# Patient Record
Sex: Male | Born: 1970 | Race: White | Hispanic: No | Marital: Single | Smoking: Current every day smoker
Health system: Southern US, Community
[De-identification: ages and names within clinical notes are randomized; demographics above are authoritative.]

## PROBLEM LIST (undated history)

## (undated) DIAGNOSIS — F1910 Other psychoactive substance abuse, uncomplicated: Secondary | ICD-10-CM

## (undated) DIAGNOSIS — R0602 Shortness of breath: Secondary | ICD-10-CM

## (undated) DIAGNOSIS — I75029 Atheroembolism of unspecified lower extremity: Secondary | ICD-10-CM

## (undated) DIAGNOSIS — J449 Chronic obstructive pulmonary disease, unspecified: Secondary | ICD-10-CM

## (undated) DIAGNOSIS — D751 Secondary polycythemia: Secondary | ICD-10-CM

## (undated) DIAGNOSIS — F419 Anxiety disorder, unspecified: Secondary | ICD-10-CM

## (undated) HISTORY — PX: KNEE ARTHROSCOPY: SUR90

## (undated) HISTORY — PX: LEG SURGERY: SHX1003

## (undated) HISTORY — DX: Other psychoactive substance abuse, uncomplicated: F19.10

## (undated) HISTORY — DX: Secondary polycythemia: D75.1

## (undated) HISTORY — PX: SHOULDER ARTHROSCOPY: SHX128

---
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
## 1998-01-22 DIAGNOSIS — F1011 Alcohol abuse, in remission: Secondary | ICD-10-CM | POA: Insufficient documentation

## 2002-02-22 DIAGNOSIS — IMO0002 Reserved for concepts with insufficient information to code with codable children: Secondary | ICD-10-CM

## 2005-02-08 ENCOUNTER — Ambulatory Visit: Payer: Self-pay | Admitting: Family Medicine

## 2006-02-13 ENCOUNTER — Ambulatory Visit: Payer: Self-pay | Admitting: Family Medicine

## 2006-07-24 ENCOUNTER — Ambulatory Visit: Payer: Self-pay | Admitting: Family Medicine

## 2006-08-09 ENCOUNTER — Encounter: Payer: Self-pay | Admitting: Internal Medicine

## 2006-08-09 DIAGNOSIS — Z720 Tobacco use: Secondary | ICD-10-CM | POA: Insufficient documentation

## 2006-08-09 DIAGNOSIS — F17200 Nicotine dependence, unspecified, uncomplicated: Secondary | ICD-10-CM

## 2006-08-31 ENCOUNTER — Encounter (INDEPENDENT_AMBULATORY_CARE_PROVIDER_SITE_OTHER): Payer: Self-pay | Admitting: *Deleted

## 2008-03-09 ENCOUNTER — Ambulatory Visit: Payer: Self-pay | Admitting: Family Medicine

## 2008-03-24 ENCOUNTER — Ambulatory Visit: Payer: Self-pay | Admitting: Family Medicine

## 2008-03-24 DIAGNOSIS — J069 Acute upper respiratory infection, unspecified: Secondary | ICD-10-CM | POA: Insufficient documentation

## 2008-04-24 DIAGNOSIS — I75029 Atheroembolism of unspecified lower extremity: Secondary | ICD-10-CM

## 2008-04-24 HISTORY — DX: Atheroembolism of unspecified lower extremity: I75.029

## 2008-08-10 ENCOUNTER — Ambulatory Visit: Payer: Self-pay | Admitting: Family Medicine

## 2008-08-10 DIAGNOSIS — J301 Allergic rhinitis due to pollen: Secondary | ICD-10-CM

## 2008-11-19 ENCOUNTER — Encounter: Admission: RE | Admit: 2008-11-19 | Discharge: 2008-11-19 | Payer: Self-pay | Admitting: Internal Medicine

## 2009-08-09 ENCOUNTER — Encounter: Payer: Self-pay | Admitting: Family Medicine

## 2009-08-10 ENCOUNTER — Encounter: Payer: Self-pay | Admitting: Family Medicine

## 2009-08-11 ENCOUNTER — Encounter: Payer: Self-pay | Admitting: Family Medicine

## 2009-09-06 ENCOUNTER — Ambulatory Visit: Payer: Self-pay | Admitting: Family Medicine

## 2009-09-06 DIAGNOSIS — M79609 Pain in unspecified limb: Secondary | ICD-10-CM

## 2009-09-06 DIAGNOSIS — E782 Mixed hyperlipidemia: Secondary | ICD-10-CM

## 2009-09-08 ENCOUNTER — Encounter: Payer: Self-pay | Admitting: Family Medicine

## 2009-09-09 LAB — CONVERTED CEMR LAB
ALT: 24 units/L (ref 0–53)
Basophils Absolute: 0 10*3/uL (ref 0.0–0.1)
Basophils Relative: 0.4 % (ref 0.0–3.0)
Calcium: 9.7 mg/dL (ref 8.4–10.5)
Eosinophils Relative: 1.5 % (ref 0.0–5.0)
GFR calc non Af Amer: 126.43 mL/min (ref >60)
Hemoglobin: 17.6 g/dL — ABNORMAL HIGH (ref 13.0–17.0)
Lymphocytes Relative: 35.1 % (ref 12.0–46.0)
Monocytes Relative: 5.9 % (ref 3.0–12.0)
Neutro Abs: 5.4 10*3/uL (ref 1.4–7.7)
Potassium: 4.7 meq/L (ref 3.5–5.1)
RBC: 5.16 M/uL (ref 4.22–5.81)
Sodium: 137 meq/L (ref 135–145)
TSH: 0.8 microintl units/mL (ref 0.35–5.50)
Total Protein: 6.9 g/dL (ref 6.0–8.3)
WBC: 9.5 10*3/uL (ref 4.5–10.5)

## 2009-10-07 ENCOUNTER — Telehealth: Payer: Self-pay | Admitting: Family Medicine

## 2009-11-29 ENCOUNTER — Encounter (INDEPENDENT_AMBULATORY_CARE_PROVIDER_SITE_OTHER): Payer: Self-pay | Admitting: *Deleted

## 2010-05-02 ENCOUNTER — Ambulatory Visit
Admission: RE | Admit: 2010-05-02 | Discharge: 2010-05-02 | Payer: Self-pay | Source: Home / Self Care | Attending: Family Medicine | Admitting: Family Medicine

## 2010-05-02 DIAGNOSIS — R059 Cough, unspecified: Secondary | ICD-10-CM | POA: Insufficient documentation

## 2010-05-02 DIAGNOSIS — R05 Cough: Secondary | ICD-10-CM | POA: Insufficient documentation

## 2010-05-24 NOTE — Letter (Signed)
Summary: Generic Letter  [place] at [facility]  [address] [place], [place] [address]   Phone: [phone]  Fax: [phone]    09/08/2009  DONIVIN WIRT [address] [place], [place]  [address]  Dear Mr. Garrison,    We have been unable to contact you by telephone regarding your lab results.  Dr. Dayton Martes wants you to know that your liver, thyroid, and kidney function are all normal.  Platelets just a little low, but not low enough to worry about.  Please make sure you get records from Podiatry.          Sincerely,   Ruthe Mannan, MD

## 2010-05-24 NOTE — Progress Notes (Signed)
Summary: pt requests chantix  Phone Note Refill Request Message from:  Fax from Pharmacy  Refills Requested: Medication #1:  Chantix  continuing month pack [facility], pt is requesting continuing month pack.  [phone]  Initial call taken by: Lowella Petties CMA,  October 07, 2009 8:24 AM    New/Updated Medications: CHANTIX 1 MG TABS (VARENICLINE TARTRATE) 1 tab by mouth 2 times daily Prescriptions: CHANTIX 1 MG TABS (VARENICLINE TARTRATE) 1 tab by mouth 2 times daily  #60 x 3   Entered and Authorized by:   Ruthe Mannan MD   Signed by:   Ruthe Mannan MD on 10/07/2009   Method used:   Electronically to        [facility] (retail)       [address]       [place], [place]  [address]       Ph: [phone]       Fax: [phone]   RxID:   [id number]

## 2010-05-24 NOTE — Assessment & Plan Note (Signed)
Summary: BIG TOE NUMB/PAIN/WAS PURPLE/RBH   Vital Signs:  Patient profile:   40 year old male Height:      72 inches Weight:      116.25 pounds BMI:     15.82 Temp:     98.7 degrees F oral Pulse rate:   72 / minute Pulse rhythm:   regular BP sitting:   120 / 80  (right arm) Cuff size:   regular  Vitals Entered By: Linde Gillis CMA Duncan Dull) (Sep 06, 2009 3:13 PM) CC: big toe numb on right foot   History of Present Illness: 40 yo new to me here for Right toe numbness and discoloration x 1 month.  Was seeing a podiatrist, Dr. 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  Referred him to Dr. Allyson Sabal (cards) who performed ABIs which were normal but his right great toe had NO waveforms. Started on Plavix, which he continues to take.  Here today to find out what else can be done because it still hurts, tingles, is cold and blue. Denies any symptoms of claudication.  Smokes 2 ppd x 20 years.  Quit for a short time years ago cold [place].    Current Medications (verified): 1)  Trazodone Hcl 50 Mg Tabs (Trazodone Hcl) .... Take 1 Tablet By Mouth At Bedtime 2)  Alprazolam 1 Mg Tbdp (Alprazolam) .... Take 1 Tablet By Mouth Two Times A Day 3)  Plavix 75 Mg Tabs (Clopidogrel Bisulfate) .... Take One Tablet By Mouth Daily 4)  Chantix Starting Month Pak 0.5 Mg X 11 & 1 Mg X 42  Misc (Varenicline Tartrate) .... 0.5mg  By Mouth Once Daily For 3 Days, Then Twice Daily For 4 Days and Then 1mg  By Mouth 2 Times Daily  Allergies: 1)  ! * Nsaids  Review of Systems      See HPI CV:  Denies chest pain or discomfort, leg cramps with exertion, and swelling of feet.  Physical Exam  General:  Well-developed,well-nourished,in no acute distress; alert,appropriate and cooperative throughout examination, nonongested. Msk:  right great toe- purlpe, cold, tender to palp. Psych:  memory intact for recent and remote, normally interactive, and good eye contact.     Impression &  Recommendations:  Problem # 1:  TOE PAIN (ICD-729.5) Assessment New Awaiting records, but apparently no embolism. Continue Plavix.   Needs to stop smoking.  See below. Check FLP, hepatic panel, BMET, TSH and CBC.  Problem # 2:  NICOTINE ADDICTION (ICD-305.1) Assessment: Unchanged Time spent with patient 25 minutes, more than 50% of this time was spent counseling patient on purple toe syndrome and smoking cessation. Will start chantix.  Follow up in one month.  The following medications were removed from the medication list:    Chantix Starting Month Pak 0.5 Mg X 11 & 1 Mg X 42 Tabs (Varenicline tartrate) .Marland Kitchen... As dir    Chantix Continuing Month Pak 1 Mg Tabs (Varenicline tartrate) .Marland Kitchen... As dir His updated medication list for this problem includes:    Chantix Starting Month Pak 0.5 Mg X 11 & 1 Mg X 42 Misc (Varenicline tartrate) .Marland Kitchen... 0.5mg  by mouth once daily for 3 days, then twice daily for 4 days and then 1mg  by mouth 2 times daily  Complete Medication List: 1)  Trazodone Hcl 50 Mg Tabs (Trazodone hcl) .... Take 1 tablet by mouth at bedtime 2)  Alprazolam 1 Mg Tbdp (Alprazolam) .... Take 1 tablet by mouth two times a day 3)  Plavix 75 Mg Tabs (  Clopidogrel bisulfate) .... Take one tablet by mouth daily 4)  Chantix Starting Month Pak 0.5 Mg X 11 & 1 Mg X 42 Misc (Varenicline tartrate) .... 0.5mg  by mouth once daily for 3 days, then twice daily for 4 days and then 1mg  by mouth 2 times daily  Other Orders: Venipuncture ([address]) TLB-Hepatic/Liver Function Pnl (80076-HEPATIC) TLB-TSH (Thyroid Stimulating Hormone) (84443-TSH) TLB-BMP (Basic Metabolic Panel-BMET) (80048-METABOL) TLB-CBC Platelet - w/Differential (85025-CBCD) Prescriptions: CHANTIX STARTING MONTH PAK 0.5 MG X 11 & 1 MG X 42  MISC (VARENICLINE TARTRATE) 0.5mg  by mouth once daily for 3 days, then twice daily for 4 days and then 1mg  by mouth 2 times daily  #1 pack x 0   Entered and Authorized by:   Ruthe Mannan MD   Signed by:    Ruthe Mannan MD on 09/06/2009   Method used:   Electronically to        [facility]ail[facility]       [address]       [place], [place]  [address]       Ph: [phone]       Fax: [phone]   RxID:   [id number]   Current Allergies (reviewed today): ! * NSAIDS

## 2010-05-24 NOTE — Letter (Signed)
Summary: Tinnie Gens Petrinitz DPM  Tinnie Gens Petrinitz DPM   Imported By: Lanelle Bal 09/17/2009 12:02:30  _____________________________________________________________________  External Attachment:    Type:   Image     Comment:   External Document

## 2010-05-24 NOTE — Letter (Signed)
Summary: [facility]  [facility]   Imported By: Lanelle Bal 09/17/2009 12:03:34  _____________________________________________________________________  External Attachment:    Type:   Image     Comment:   External Document

## 2010-05-24 NOTE — Letter (Signed)
Summary: Nadara Eaton letter  [place] at [facility]  [address] [place], [place] [address]   Phone: [phone]  Fax: [phone]       11/29/2009 MRN: [id number]  [facility] [address] [place], [place]  [address]  Dear Mr. [facility] [place] and [place] announce the retirement of Arta Silence, M.D., from full-time practice at the [facility] office effective October 21, 2009 and his plans of returning part-time.  It is important to Dr. Hetty Ely and to our practice that you understand that [facility] has seven physicians in our office for your health care needs.  We will continue to offer the same exceptional care that you have today.    Dr. Hetty Ely has spoken to many of you about his plans for retirement and returning part-time in the fall.   We will continue to work with you through the transition to schedule appointments for you in the office and meet the high standards that [place] is committed to.   Again, it is with great pleasure that we share the news that Dr. Hetty Ely will return to [facility] in October of 2011 with a reduced schedule.    If you have any questions, or would like to request an appointment with one of our physicians, please call us at [phone] and press the option for Scheduling an appointment.  We take pleasure in providing you with excellent patient care and look forward to seeing you at your next office visit.  Our [facility] Physicians are:  Tillman Abide, M.D. Laurita Quint, M.D. Roxy Manns, M.D. Kerby Nora, M.D. Hannah Beat, M.D. Ruthe Mannan, M.D. We proudly welcomed Raechel Ache, M.D. and Eustaquio Boyden, M.D. to the practice in July/August 2011.  Sincerely,   [facility]

## 2010-05-24 NOTE — Letter (Signed)
Summary: [facility] / EVAL OF PURPLE R GREAT TOE /  [facility] / EVAL OF PURPLE R GREAT TOE / DR. Christiane Ha BERRY   Imported By: Carin Primrose 08/16/2009 14:50:40  _____________________________________________________________________  External Attachment:    Type:   Image     Comment:   External Document

## 2010-05-26 NOTE — Assessment & Plan Note (Signed)
Summary: COUGH   Vital Signs:  Patient profile:   40 year old male Height:      72 inches Weight:      118 pounds BMI:     16.06 Temp:     98.2 degrees F oral Pulse rate:   88 / minute Pulse rhythm:   regular BP sitting:   124 / 82  (left arm) Cuff size:   regular  Vitals Entered By: Delilah Shan CMA Sara Keys Dull) (May 02, 2010 11:04 AM) CC: Cough   History of Present Illness: Cough for last month and headcold.  Up at night coughing.  Not improving.  Used delsym and otc meds w/o much relief.  No nausea, no fevers.  Fatigued.  Clear sputum.  Occ rhinorrhea, but no nasal congestion. +wheeze.      Allergies: 1)  ! * Nsaids  Past History:  Past Medical History: tobacco abuse- didn't tolerate chantix insomnia H/o blue R 1st toe that, no waveform on prev exam by cards.  Prev was on plavix, off as of 2012.   Social History: Reviewed history and no changes required. 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  Otherwise negative.    Physical Exam  General:  thin, no apparent distress normocephalic atraumatic tm wnl nasal exam w/o erthema op with cobblestoning but no exudates. neck supple regular rate and rhythm lungs with diffuse bilateral exp wheezes but no decrease in bs locally ext w/o edema   Impression & Recommendations:  Problem # 1:  COUGH (ICD-786.2) d/w patient about stopping smoking, esp trying the gum.  He needs to quit.  Start prednisone and SABA.  w/o fever, shortness of breath, focal dec in bs, or change in sputum, I would not start antibiotics.  follow up as needed.  Nontoxic.  He agrees with plan .  Complete Medication List: 1)  Trazodone Hcl 50 Mg Tabs (Trazodone hcl) .... Take 1 tablet by mouth at bedtime 2)  Alprazolam 1 Mg Tbdp (Alprazolam) .... Take 1 tablet by mouth two times a day 3)  Ventolin Hfa 108 (90 Base) Mcg/act Aers (Albuterol sulfate) .... 2 puffs every  4 hours as needed for cough 4)  Prednisone 20 Mg Tabs (Prednisone) .Marland Kitchen.. 1 by mouth once daily for 5 days and then 1/2 by mouth once daily for 6 days. take with food.  Patient Instructions: 1)  Start the prednisone today.  Take as directed with food.  Use the inhaler as needed, every 4 hours.  If you aren't improving, let us know.  Get the nicotine gum (flavored) and try that to quit smoking.   Prescriptions: PREDNISONE 20 MG TABS (PREDNISONE) 1 by mouth once daily for 5 days and then 1/2 by mouth once daily for 6 days. take with food.  #8 x 0   Entered and Authorized by:   Crawford Givens MD   Signed by:   Crawford Givens MD on 05/02/2010   Method used:   [facility]  [place] (retail)       [address]       [place], [place]  [address]       Ph: [phone] or [phone]       Fax: [phone]   RxID:   [id number] VENTOLIN HFA 108 (90 BASE) MCG/ACT AERS (ALBUTEROL SULFATE) 2 puffs every 4 hours as needed  for cough  #1 x 1   Entered and Authorized by:   Crawford Givens MD   Signed by:   Crawford Givens MD on 05/02/2010   Method used:   [facility]        [facility]  [place] [facility]retail[facility]       [address]       [place], [place]  [address]       Ph: [phone] or [phone]       Fax: [phone]   RxID:   [id number]    Orders Added: 1)  Est. Patient Level III [[address]]    Current Allergies (reviewed today): ! * NSAIDS

## 2013-01-14 ENCOUNTER — Emergency Department: Payer: Self-pay | Admitting: Emergency Medicine

## 2013-01-14 LAB — URINALYSIS, COMPLETE
Glucose,UR: NEGATIVE mg/dL (ref 0–75)
Hyaline Cast: 2
Ketone: NEGATIVE
Specific Gravity: 1.012 (ref 1.003–1.030)
Squamous Epithelial: NONE SEEN
WBC UR: 2 /HPF (ref 0–5)

## 2013-01-14 LAB — COMPREHENSIVE METABOLIC PANEL
Albumin: 4 g/dL (ref 3.4–5.0)
Alkaline Phosphatase: 91 U/L (ref 50–136)
Anion Gap: 8 (ref 7–16)
BUN: 9 mg/dL (ref 7–18)
Bilirubin,Total: 0.5 mg/dL (ref 0.2–1.0)
Chloride: 99 mmol/L (ref 98–107)
Co2: 27 mmol/L (ref 21–32)
Creatinine: 0.96 mg/dL (ref 0.60–1.30)
EGFR (African American): >60
Glucose: 107 mg/dL — ABNORMAL HIGH (ref 65–99)
Potassium: 3.3 mmol/L — ABNORMAL LOW (ref 3.5–5.1)
SGPT (ALT): 26 U/L (ref 12–78)
Sodium: 134 mmol/L — ABNORMAL LOW (ref 136–145)

## 2013-01-14 LAB — CBC
HCT: 58.3 % — ABNORMAL HIGH (ref 40.0–52.0)
HGB: 19.9 g/dL — ABNORMAL HIGH (ref 13.0–18.0)
MCH: 34.3 pg — ABNORMAL HIGH (ref 26.0–34.0)
Platelet: 136 10*3/uL — ABNORMAL LOW (ref 150–440)
RBC: 5.81 10*6/uL (ref 4.40–5.90)
WBC: 12.3 10*3/uL — ABNORMAL HIGH (ref 3.8–10.6)

## 2013-01-20 ENCOUNTER — Encounter (INDEPENDENT_AMBULATORY_CARE_PROVIDER_SITE_OTHER): Payer: Self-pay | Admitting: Surgery

## 2013-01-20 ENCOUNTER — Ambulatory Visit (INDEPENDENT_AMBULATORY_CARE_PROVIDER_SITE_OTHER): Payer: BC Managed Care – PPO | Admitting: Surgery

## 2013-01-20 VITALS — BP 116/68 | HR 100 | Temp 99.1°F | Resp 14 | Ht 69.0 in | Wt 119.8 lb

## 2013-01-20 DIAGNOSIS — K8020 Calculus of gallbladder without cholecystitis without obstruction: Secondary | ICD-10-CM

## 2013-01-20 NOTE — Progress Notes (Signed)
Patient ID: Walter Alvarado, male   DOB: 1971-02-14, 42 y.o.   MRN: [id number]  Chief Complaint  Patient presents with  . New Evaluation    eval GB    HPI Walter Alvarado is a 42 y.o. male.  Patient sent at request of Dr. Janalyn Harder for epigastric abdominal pain. He was seen at the [facility] emergency department on 01/14/13 for epigastric abdominal pain. This had lasted for hours. It was severe. Associated nausea and vomiting. Ultrasound showed gallstones no gallbladder wall thickening. Symptoms made worse with eating. No radiation. Pain is sharp in nature. This subsided he was sent home. HPI  Past Medical History  Diagnosis Date  . Substance abuse     Past Surgical History  Procedure Laterality Date  . Leg surgery      broke leg    History reviewed. No pertinent family history.  Social History History  Substance Use Topics  . Smoking status: Current Every Day Smoker -- 2.00 packs/day  . Smokeless tobacco: Never Used  . Alcohol Use: Yes    Allergies  Allergen Reactions  . Nsaids     REACTION: INTOLERANT    Current Outpatient Prescriptions  Medication Sig Dispense Refill  . ALPRAZolam (XANAX) 1 MG tablet       . HYDROcodone-acetaminophen (NORCO/VICODIN) 5-325 MG per tablet       . ondansetron (ZOFRAN) 4 MG tablet       . traZODone (DESYREL) 50 MG tablet        No current facility-administered medications for this visit.    Review of Systems Review of Systems  Constitutional: Negative for fever, chills and unexpected weight change.  HENT: Negative for hearing loss, congestion, sore throat, trouble swallowing and voice change.   Eyes: Negative for visual disturbance.  Respiratory: Positive for cough. Negative for wheezing.   Cardiovascular: Negative for chest pain, palpitations and leg swelling.  Gastrointestinal: Positive for abdominal pain. Negative for nausea, vomiting, diarrhea, constipation, blood in stool, abdominal distention, anal  bleeding and rectal pain.  Genitourinary: Negative for hematuria and difficulty urinating.  Musculoskeletal: Negative for arthralgias.  Skin: Negative for rash and wound.  Neurological: Negative for seizures, syncope, weakness and headaches.  Hematological: Negative for adenopathy. Does not bruise/bleed easily.  Psychiatric/Behavioral: Negative for confusion.    Blood pressure 116/68, pulse 100, temperature 99.1 F (37.3 C), temperature source Temporal, resp. rate 14, height 5\' 9"  (1.753 m), weight 119 lb 12.8 oz (54.341 kg).  Physical Exam Physical Exam  Constitutional: He is oriented to person, place, and time. He appears well-developed and well-nourished.  HENT:  Head: Normocephalic and atraumatic.  Eyes: EOM are normal. Pupils are equal, round, and reactive to light. No scleral icterus.  Neck: Normal range of motion.  Cardiovascular: Normal rate and regular rhythm.   Pulmonary/Chest: Effort normal and breath sounds normal.  Abdominal: Soft. Bowel sounds are normal. He exhibits no mass. There is no tenderness. There is no rebound and no guarding.  Musculoskeletal: Normal range of motion.  Neurological: He is alert and oriented to person, place, and time.  Skin: Skin is warm and dry.  Psychiatric: He has a normal mood and affect. His behavior is normal. Judgment and thought content normal.    Data Reviewed U/s and labs from [facility]  Gallstones and normal  LFT.  CBD 5 mm   Assessment    Symptomatic cholelithiasis    Plan    Recommend laparoscopic cholecystectomy cholangiogram.The procedure has been discussed with the  patient. Operative and non operative treatments have been discussed. Risks of surgery include bleeding, infection,  Common bile duct injury,  Injury to the stomach,liver, colon,small intestine, abdominal wall,  Diaphragm,  Major blood vessels,  And the need for an open procedure.  Other risks include worsening of medical problems, death,  DVT and pulmonary embolism,  and cardiovascular events.   Medical options have also been discussed. The patient has been informed of long term expectations of surgery and non surgical options,  The patient agrees to proceed.         Taneal Sonntag A. 01/20/2013, 11:55 AM

## 2013-01-20 NOTE — Patient Instructions (Signed)

## 2013-01-29 ENCOUNTER — Encounter (INDEPENDENT_AMBULATORY_CARE_PROVIDER_SITE_OTHER): Payer: Self-pay

## 2013-01-31 ENCOUNTER — Encounter (HOSPITAL_COMMUNITY): Payer: Self-pay | Admitting: Pharmacy Technician

## 2013-02-03 ENCOUNTER — Encounter (HOSPITAL_COMMUNITY): Payer: Self-pay | Admitting: Vascular Surgery

## 2013-02-03 ENCOUNTER — Ambulatory Visit (HOSPITAL_COMMUNITY)
Admission: RE | Admit: 2013-02-03 | Discharge: 2013-02-03 | Disposition: A | Discharge status: Home / Self Care | Payer: BC Managed Care – PPO | Source: Ambulatory Visit | Attending: Anesthesiology | Admitting: Anesthesiology

## 2013-02-03 ENCOUNTER — Other Ambulatory Visit (INDEPENDENT_AMBULATORY_CARE_PROVIDER_SITE_OTHER): Payer: Self-pay | Admitting: Surgery

## 2013-02-03 ENCOUNTER — Encounter (HOSPITAL_COMMUNITY)
Admission: RE | Admit: 2013-02-03 | Discharge: 2013-02-03 | Disposition: A | Discharge status: Home / Self Care | Payer: BC Managed Care – PPO | Source: Ambulatory Visit | Attending: Surgery | Admitting: Surgery

## 2013-02-03 ENCOUNTER — Encounter (HOSPITAL_COMMUNITY): Payer: Self-pay

## 2013-02-03 DIAGNOSIS — Z01818 Encounter for other preprocedural examination: Secondary | ICD-10-CM | POA: Insufficient documentation

## 2013-02-03 DIAGNOSIS — Z01810 Encounter for preprocedural cardiovascular examination: Secondary | ICD-10-CM | POA: Insufficient documentation

## 2013-02-03 DIAGNOSIS — Z01812 Encounter for preprocedural laboratory examination: Secondary | ICD-10-CM | POA: Insufficient documentation

## 2013-02-03 DIAGNOSIS — K8020 Calculus of gallbladder without cholecystitis without obstruction: Secondary | ICD-10-CM | POA: Insufficient documentation

## 2013-02-03 HISTORY — DX: Atheroembolism of unspecified lower extremity: I75.029

## 2013-02-03 HISTORY — DX: Anxiety disorder, unspecified: F41.9

## 2013-02-03 HISTORY — DX: Shortness of breath: R06.02

## 2013-02-03 LAB — CBC WITH DIFFERENTIAL/PLATELET
Basophils Relative: 0 % (ref 0–1)
HCT: 57.8 % — ABNORMAL HIGH (ref 39.0–52.0)
Hemoglobin: 20.5 g/dL — ABNORMAL HIGH (ref 13.0–17.0)
MCH: 35.3 pg — ABNORMAL HIGH (ref 26.0–34.0)
MCHC: 35.5 g/dL (ref 30.0–36.0)
MCV: 99.7 fL (ref 78.0–100.0)
Monocytes Absolute: 0.7 10*3/uL (ref 0.1–1.0)
Monocytes Relative: 8 % (ref 3–12)
Neutro Abs: 5.3 10*3/uL (ref 1.7–7.7)

## 2013-02-03 LAB — COMPREHENSIVE METABOLIC PANEL
Albumin: 4 g/dL (ref 3.5–5.2)
BUN: 8 mg/dL (ref 6–23)
CO2: 27 mEq/L (ref 19–32)
Chloride: 95 mEq/L — ABNORMAL LOW (ref 96–112)
Creatinine, Ser: 0.69 mg/dL (ref 0.50–1.35)
GFR calc non Af Amer: >90 mL/min (ref >90)
Total Bilirubin: 0.5 mg/dL (ref 0.3–1.2)

## 2013-02-03 NOTE — Progress Notes (Signed)
Notified Charlie Pitter of pt. Substance abuse. Encourage to not to use marijuana several days prior to surgery.

## 2013-02-03 NOTE — Pre-Procedure Instructions (Signed)
JEREMAIH KLIMA  02/03/2013   Your procedure is scheduled on:  Thursday, October 23  Report to [facility] Main Entrance "A" at 10:00 AM.  Call this number if you have problems the morning of surgery: [phone]   Remember:   Do not eat food or drink liquids after midnight.   Take these medicines the morning of surgery with A SIP OF WATER: xanax   Do not wear jewelry, make-up or nail polish.  Do not wear lotions, powders, or perfumes. You may wear deodorant.  Do not shave 48 hours prior to surgery. Men may shave face and neck.  Do not bring valuables to the hospital.  [facility] is not responsible                  for any belongings or valuables.               Contacts, dentures or bridgework may not be worn into surgery.  Leave suitcase in the car. After surgery it may be brought to your room.  For patients admitted to the hospital, discharge time is determined by your                treatment team.               Patients discharged the day of surgery will not be allowed to drive  home.  Name and phone number of your driver: Rachael Rocca-mother  Special Instructions: Shower using CHG 2 nights before surgery and the night before surgery.  If you shower the day of surgery use CHG.  Use special wash - you have one bottle of CHG for all showers.  You should use approximately 1/3 of the bottle for each shower.   Please read over the following fact sheets that you were given: Pain Booklet, Coughing and Deep Breathing and Surgical Site Infection Prevention

## 2013-02-04 ENCOUNTER — Telehealth (INDEPENDENT_AMBULATORY_CARE_PROVIDER_SITE_OTHER): Payer: Self-pay

## 2013-02-04 ENCOUNTER — Encounter (HOSPITAL_COMMUNITY): Payer: Self-pay

## 2013-02-04 DIAGNOSIS — D45 Polycythemia vera: Secondary | ICD-10-CM

## 2013-02-04 NOTE — Telephone Encounter (Signed)
Spoke to pt mother. Advised we will need referral to hematology for clearance. 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 Will try to get appt this week so his surgery does not get moved back. We will call with appt.

## 2013-02-04 NOTE — Progress Notes (Signed)
Anesthesia Chart Review:  Patient is a 42 year old male scheduled for laparoscopic cholecystectomy on 02/13/13 by Dr. Luisa Hart.  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  He was previously followed at [facility], but has not been seen there since 2012.  Meds listed: Xanax PRN, trazadone Q HS.  EKG on 02/03/13 showed NSR, LAD. Currently, there are no comparison EKGs available.  CXR on 02/03/13 showed no active cardiopulmonary disease.  Preoperative labs noted. H/H 20.5/57.8. (Previously 17.6/51.8.)  PLT count 142K.  LFTs WNL. He did not report a history of known polycythemia. I reviewed with anesthesiologist Dr. Noreene Larsson who agrees with preoperative hematology evaluation.  He will notify Dr. Luisa Hart.  Velna Ochs [facility] Short Stay Center/Anesthesiology Phone [phone] 02/04/2013 5:06 PM

## 2013-02-05 ENCOUNTER — Telehealth: Payer: Self-pay | Admitting: Oncology

## 2013-02-05 ENCOUNTER — Telehealth (INDEPENDENT_AMBULATORY_CARE_PROVIDER_SITE_OTHER): Payer: Self-pay | Admitting: General Surgery

## 2013-02-05 NOTE — Telephone Encounter (Signed)
C/D 1015/14 for appt. 02/17/13

## 2013-02-05 NOTE — Telephone Encounter (Signed)
S/W REFERRING OFFICE AND SCHEDULE NP APPT 10/27 @ 10:30 W/DR. LIVESAY REFERRING DR. Luisa Hart  DX-POLYCTHEMIA WELCOME PACKET MAILED.

## 2013-02-05 NOTE — Telephone Encounter (Signed)
Spoke with pt and his mother an explained that I was able to get him scheduled with Dr. Darrold Span on 02/17/13 at 10:30.  Address was given.  I also explained that because we were unable to get the appointment before his scheduled surgery date, we would have to postpone the surgery until we received clearance from Dr. Darrold Span.  The patient verbalized that he understood.

## 2013-02-14 ENCOUNTER — Encounter (INDEPENDENT_AMBULATORY_CARE_PROVIDER_SITE_OTHER): Payer: BC Managed Care – PPO | Admitting: Surgery

## 2013-02-15 ENCOUNTER — Other Ambulatory Visit: Payer: Self-pay | Admitting: Oncology

## 2013-02-15 DIAGNOSIS — D751 Secondary polycythemia: Secondary | ICD-10-CM

## 2013-02-17 ENCOUNTER — Ambulatory Visit (HOSPITAL_BASED_OUTPATIENT_CLINIC_OR_DEPARTMENT_OTHER): Payer: BC Managed Care – PPO | Admitting: Oncology

## 2013-02-17 ENCOUNTER — Ambulatory Visit (HOSPITAL_BASED_OUTPATIENT_CLINIC_OR_DEPARTMENT_OTHER): Payer: BC Managed Care – PPO

## 2013-02-17 ENCOUNTER — Other Ambulatory Visit (HOSPITAL_BASED_OUTPATIENT_CLINIC_OR_DEPARTMENT_OTHER): Payer: BC Managed Care – PPO | Admitting: Lab

## 2013-02-17 ENCOUNTER — Encounter: Payer: Self-pay | Admitting: Oncology

## 2013-02-17 VITALS — BP 127/86 | HR 86 | Temp 98.5°F | Resp 18 | Ht 69.5 in | Wt 120.7 lb

## 2013-02-17 DIAGNOSIS — F1010 Alcohol abuse, uncomplicated: Secondary | ICD-10-CM

## 2013-02-17 DIAGNOSIS — R718 Other abnormality of red blood cells: Secondary | ICD-10-CM

## 2013-02-17 DIAGNOSIS — D696 Thrombocytopenia, unspecified: Secondary | ICD-10-CM

## 2013-02-17 DIAGNOSIS — D46C Myelodysplastic syndrome with isolated del(5q) chromosomal abnormality: Secondary | ICD-10-CM

## 2013-02-17 DIAGNOSIS — D45 Polycythemia vera: Secondary | ICD-10-CM

## 2013-02-17 DIAGNOSIS — E860 Dehydration: Secondary | ICD-10-CM

## 2013-02-17 DIAGNOSIS — F17200 Nicotine dependence, unspecified, uncomplicated: Secondary | ICD-10-CM

## 2013-02-17 DIAGNOSIS — D751 Secondary polycythemia: Secondary | ICD-10-CM

## 2013-02-17 DIAGNOSIS — K8020 Calculus of gallbladder without cholecystitis without obstruction: Secondary | ICD-10-CM

## 2013-02-17 LAB — MORPHOLOGY

## 2013-02-17 LAB — CBC WITH DIFFERENTIAL/PLATELET
BASO%: 0.6 % (ref 0.0–2.0)
LYMPH%: 24.1 % (ref 14.0–49.0)
MCH: 34.3 pg — ABNORMAL HIGH (ref 27.2–33.4)
MCHC: 33.7 g/dL (ref 32.0–36.0)
MCV: 101.9 fL — ABNORMAL HIGH (ref 79.3–98.0)
MONO%: 9.1 % (ref 0.0–14.0)
Platelets: 111 10*3/uL — ABNORMAL LOW (ref 140–400)
RBC: 5.9 10*6/uL — ABNORMAL HIGH (ref 4.20–5.82)

## 2013-02-17 LAB — URINALYSIS, MICROSCOPIC - CHCC
Glucose: 500 mg/dL
Nitrite: NEGATIVE
Urobilinogen, UR: 0.2 mg/dL (ref 0.2–1)

## 2013-02-17 NOTE — Patient Instructions (Signed)
Drink 8-16 ounces of plain fluid (no caffeine, no alcohol) for every Mtn Dew and every beer that you drink, which should be more plain fluid than the caffeine and alcohol.  Drink caffeine free Mtn Dew as possible  For next 24 hours, keep written log of every cigarette you smoke, what time/ where/ what you are doing. Usually 1/2 - 2/3 are habit cigarettes, which you can stop as soon as you know which are the habit ones. This will decrease nicotine in your system and make it easier to quit.   Willette Pa will call you today about smoking cessation assistance from [facility] -- classes, pointers, coupons etc.  We will ask lung doctors to check your lung function also.   Call if questions later this week   [phone]

## 2013-02-17 NOTE — Progress Notes (Signed)
Checked in new patient with no finanacial issues. Gave appt card.

## 2013-02-18 ENCOUNTER — Telehealth: Payer: Self-pay | Admitting: *Deleted

## 2013-02-18 ENCOUNTER — Encounter: Payer: Self-pay | Admitting: Oncology

## 2013-02-18 DIAGNOSIS — F1010 Alcohol abuse, uncomplicated: Secondary | ICD-10-CM | POA: Insufficient documentation

## 2013-02-18 DIAGNOSIS — D751 Secondary polycythemia: Secondary | ICD-10-CM | POA: Insufficient documentation

## 2013-02-18 DIAGNOSIS — K8020 Calculus of gallbladder without cholecystitis without obstruction: Secondary | ICD-10-CM | POA: Insufficient documentation
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
## 2013-02-18 NOTE — Telephone Encounter (Signed)
Talked and spoke with pt regarding smoking cessation.  He stated he was going to stop after his last pack.  We discussed side effects of nicotine withdrawal.  We discussed chewing on straws.  He stated he also like sucking on lifesavers.  He seems motivated to quit.  I will send information on smoking cessation class.

## 2013-02-24 ENCOUNTER — Encounter (INDEPENDENT_AMBULATORY_CARE_PROVIDER_SITE_OTHER): Payer: BC Managed Care – PPO | Admitting: Surgery

## 2013-03-03 ENCOUNTER — Encounter (INDEPENDENT_AMBULATORY_CARE_PROVIDER_SITE_OTHER): Payer: Self-pay

## 2013-03-03 ENCOUNTER — Ambulatory Visit (HOSPITAL_BASED_OUTPATIENT_CLINIC_OR_DEPARTMENT_OTHER): Payer: BC Managed Care – PPO | Admitting: Oncology

## 2013-03-03 ENCOUNTER — Encounter: Payer: Self-pay | Admitting: Oncology

## 2013-03-03 ENCOUNTER — Telehealth: Payer: Self-pay | Admitting: *Deleted

## 2013-03-03 ENCOUNTER — Other Ambulatory Visit (HOSPITAL_BASED_OUTPATIENT_CLINIC_OR_DEPARTMENT_OTHER): Payer: BC Managed Care – PPO | Admitting: Lab

## 2013-03-03 VITALS — BP 116/80 | HR 109 | Temp 98.3°F | Resp 18 | Ht 69.0 in | Wt 121.5 lb

## 2013-03-03 DIAGNOSIS — F17200 Nicotine dependence, unspecified, uncomplicated: Secondary | ICD-10-CM

## 2013-03-03 DIAGNOSIS — D696 Thrombocytopenia, unspecified: Secondary | ICD-10-CM

## 2013-03-03 DIAGNOSIS — F1010 Alcohol abuse, uncomplicated: Secondary | ICD-10-CM

## 2013-03-03 DIAGNOSIS — D751 Secondary polycythemia: Secondary | ICD-10-CM

## 2013-03-03 LAB — CBC & DIFF AND RETIC
Basophils Absolute: 0 10*3/uL (ref 0.0–0.1)
Eosinophils Absolute: 0.1 10*3/uL (ref 0.0–0.5)
HCT: 59.5 % — ABNORMAL HIGH (ref 38.4–49.9)
HGB: 21 g/dL — ABNORMAL HIGH (ref 13.0–17.1)
Immature Retic Fract: 1.6 % — ABNORMAL LOW (ref 3.00–10.60)
LYMPH%: 30.8 % (ref 14.0–49.0)
MCH: 35.2 pg — ABNORMAL HIGH (ref 27.2–33.4)
MCV: 99.7 fL — ABNORMAL HIGH (ref 79.3–98.0)
MONO#: 0.7 10*3/uL (ref 0.1–0.9)
MONO%: 7.6 % (ref 0.0–14.0)
NEUT#: 5.8 10*3/uL (ref 1.5–6.5)
NEUT%: 60.7 % (ref 39.0–75.0)
Platelets: 120 10*3/uL — ABNORMAL LOW (ref 140–400)
RDW: 13.5 % (ref 11.0–14.6)
WBC: 9.6 10*3/uL (ref 4.0–10.3)
nRBC: 0 % (ref 0–0)

## 2013-03-03 NOTE — Progress Notes (Signed)
OFFICE PROGRESS NOTE   03/03/2013   Physicians:   Warren Lacy, Crawford Givens (PCP [facility]). New patient referral M.Ramaswamy 03-10-13   INTERVAL HISTORY:  Patient is seen, alone for visit, in follow up of elevated hemoglobin/ hematocrit which were found during preop evaluation for cholecystectomy, which has been put on hold until hematologic condition improved. It seems likely that the polycythemia is related to lifestyle factors (heavy tobacco, caffeine & ETOH exclusively as fluids), tho this still may be a primary condition.  Since he was here for new patient consultation 2 weeks ago, Mr Knoll has decreased smoking from 1.5 - 2 ppd down to 5 cigarettes daily for the past week. He was previously drinking at least 6 cans of [facility] and 6 beers daily; and now is drinking 2 cans of [facility], 2 beers and 2.5 large gatorades daily He is having some HA which seem to be caffeine withdrawal, no other neurologic symptoms. He had one episode of RUQ pain on 02-21-13 after eating hot dog with chili, not as intense as presentation with cholelithiasis and resolved with 2 vicodin. He feels more energetic since he has resumed eating his regular diet. He is irritable with decrease in smoking but very determined to continue this progress. He does not complain of puritis or increased SOB.  He hopes all procedures can be completed by end of year as he has met deductible for insurance.    HISTORY Patient has had no regular medical care in years, tho has been seen possibly for bronchitis by [place] [facility], and only prior CBC available is from 08-2009. He has long, heavy, ongoing tobacco and alcohol use. He was in usual health until acute, severe RUQ pain on 01-14-13, which persisted for several hours such that he presented to [facility] ED. [place] there reportedly showed gallstones, with liver and pancreas normal and no mention of spleen. Per fiance, he was given dilaudid in ED, with  decrease in O2 sat to 83 after that medication. He was referred to Dr Luisa Hart and scheduled for laparoscopic cholecystectomy on 02-13-13, however preop labs 02-03-2013 had hemoglobin 20.5 with HCT 57.8, also WBC 8.3, platelets 142k, normal automated differential and MCV 99.7. The previous CBC 09-06-2009 had WBC 9.5, Hgb 17.6, HCT 51.8, plt 142k and MCV 100.4. CMET was normal other than Na 134 and Cl 95. At consultation visit at [facility] on 02-17-13, CBC had WBC 7.9, Hgb 20.3, HCT 60.1 and plt 111k; urinalysis was negative for blood; erythropoietin level was 4.7.   Review of systems as above, also: No fever. No nausea or vomiting. No cough. No other neurologic symptoms.  Remainder of 10 point Review of Systems negative.  Objective:  Vital signs in last 24 hours:  BP 116/80  Pulse 109  Temp(Src) 98.3 F (36.8 C) (Oral)  Resp 18  Ht 5\' 9"  (1.753 m)  Wt 121 lb 8 oz (55.112 kg)  BMI 17.93 kg/m2  Weight is up 1 lb. Hyperemic/ flushed as previously Alert, oriented and appropriate. Easily mobile.    HEENT:PERRL, sclerae not icteric. Oral mucosa moist without lesions, posterior pharynx clear.  Neck supple. No JVD.  Lymphatics:no cervical,suraclavicular, axillary adenopathy Resp: diminished breath sounds thruout otherwise clear to auscultation bilaterally and percussion bilaterally Cardio: regular rate and rhythm, clear heart sounds GI: soft, nontender, not distended, no mass or organomegaly. Normally active bowel sounds Musculoskeletal/ Extremities: without pitting edema, cords, tenderness Neuro: CN, motor, sensory, cerebellar grossly nonfocal. Psych as above Skin without rash,  ecchymosis, petechiae. Diffusely flushed  .   Lab Results:  Results for orders placed in visit on 03/03/13  CBC & DIFF AND RETIC      Result Value Range   WBC 9.6  4.0 - 10.3 10e3/uL   NEUT# 5.8  1.5 - 6.5 10e3/uL   HGB 21.0 (*) 13.0 - 17.1 g/dL   HCT 09.8 (*) 11.9 - 14.7 %   Platelets 120 (*) 140 - 400 10e3/uL    MCV 99.7 (*) 79.3 - 98.0 fL   MCH 35.2 (*) 27.2 - 33.4 pg   MCHC 35.3  32.0 - 36.0 g/dL   RBC 8.29 (*) 5.62 - 1.30 10e6/uL   RDW 13.5  11.0 - 14.6 %   lymph# 3.0  0.9 - 3.3 10e3/uL   MONO# 0.7  0.1 - 0.9 10e3/uL   Eosinophils Absolute 0.1  0.0 - 0.5 10e3/uL   Basophils Absolute 0.0  0.0 - 0.1 10e3/uL   NEUT% 60.7  39.0 - 75.0 %   LYMPH% 30.8  14.0 - 49.0 %   MONO% 7.6  0.0 - 14.0 %   EOS% 0.7  0.0 - 7.0 %   BASO% 0.2  0.0 - 2.0 %   nRBC 0  0 - 0 %   Retic % 0.92  0.80 - 1.80 %   Retic Ct Abs 54.92  34.80 - 93.90 10e3/uL   Immature Retic Fract 1.60 (*) 3.00 - 10.60 %     Studies/Results:  No results found.  Medications: I have reviewed the patient's current medications.  DISCUSSION: we have discussed smoking cessation, decrease in caffeine and ETOH, increase in free water intake, with considerable progress on his part in a very short time. It may take longer than time thus far to see any improvement in counts if these factors are responsible for secondary polycythemia. He understands that the problem could still be primary, but situation seems stable to follow closely. Phlebotomy has not been done as it seems likely he is still dehydrated to some extent.  He is aware of pulmonary consultation, ? If enough lung disease to contribute to polycythemia  Assessment/Plan: 1.Polycythemia: At this point seems most likely secondary, with history suggesting long, heavy tobacco abuse and chronic dehydration as causative. Tho other counts do not suggest P.vera, note low epo, so present plan is to follow closely as he improves the other factors.  2.thrombocytopenia: slightly better today, no bleeding. Likely from ETOH suppression of bone marrow or possibly splenomegaly that I cannot appreciate on exam now.  3.elevated MCV likely with ETOH  4.cholelithiasis: symptomatic late Sept, laparoscopic cholecystectomy on hold with polycythemia. Patient understands to contact surgeon or go to ED if  recurrent significant pain  5.ETOH abuse ongoing, tho he has cut back in past 2 weeks 6.tobacco abuse: actively trying to stop smoking.   7.strong family history of heart disease, including MI in 81 yo sister. No clear concerns from history above, but obviously high risk with tobacco and family history  8.no other recent medical care   I will see him again with counts in ~ 2 weeks. All questions answered and he can call if needed prior to next appointment.    Matvey Llanas P, MD   03/03/2013, 11:30 PM

## 2013-03-03 NOTE — Patient Instructions (Signed)
Continue lots of nonalcoholic fluids with no caffeine   Keep working to stop smoking entirely.   Call if needed before next scheduled appointment   [phone]

## 2013-03-03 NOTE — Telephone Encounter (Signed)
appts made and printed...td 

## 2013-03-03 NOTE — Telephone Encounter (Signed)
Spoke with pt today regarding smoking cessation.  Pt stated he had cut back from 2 packs a day to 5-6 cigaretts a day.  I asked if he would be willing to go to class or me to mail resource information, he is not interested at this time.  I gave him my phone number to call if I could help with any further efforts to quit smoking.  He was thankful for the phone call.

## 2013-03-10 ENCOUNTER — Ambulatory Visit (INDEPENDENT_AMBULATORY_CARE_PROVIDER_SITE_OTHER): Payer: BC Managed Care – PPO | Admitting: Internal Medicine

## 2013-03-10 ENCOUNTER — Encounter: Payer: Self-pay | Admitting: Internal Medicine

## 2013-03-10 VITALS — BP 118/80 | HR 88 | Ht 69.5 in | Wt 122.2 lb

## 2013-03-10 DIAGNOSIS — R0602 Shortness of breath: Secondary | ICD-10-CM

## 2013-03-10 DIAGNOSIS — F17200 Nicotine dependence, unspecified, uncomplicated: Secondary | ICD-10-CM

## 2013-03-10 DIAGNOSIS — J449 Chronic obstructive pulmonary disease, unspecified: Secondary | ICD-10-CM

## 2013-03-10 MED ORDER — TIOTROPIUM BROMIDE MONOHYDRATE 18 MCG IN CAPS: 18.0000 ug | ORAL_CAPSULE | Freq: Every day | RESPIRATORY_TRACT | Status: DC

## 2013-03-10 NOTE — Patient Instructions (Signed)
#  COPD - you have severe copd  - taking daily inhalers will  Help with stress relief on your lungs and reduce your hemoglobin - Please start spiriva 1 puff daily - take sample, script and show technique - glad you had flu shot  - advised pneumonia vaccine but you refused - follow with primary care doctor due to $ issues  - he can add other inhalers if needed  - would recommend him to check alpha 1 genetic test for copd when you can afford  #SMoking tobacco and marijuan  - you need to quit, take help from primary care doctor  #Followup  - as needed in pulmonary offfice  - primary care to manage you due to $ issues

## 2013-03-10 NOTE — Progress Notes (Signed)
Subjective:    Patient ID: Walter Alvarado, male    DOB: August 15, 1970, 42 y.o.   MRN: [id number] PCP Laurita Quint, MD Hematologist - Dr Jama Flavors HPI  42 year old male referred to pulmonary by hematologist for potential pulmonary issues. He presented for cholecystectomy but was found to have a hemoglobin of 20 g percent and subsequently ended up in the hematologist office. Workup such as second polycythemia due to heavy caffeine, alcohol, smoking tobacco and smoking marijuana use with associated dehydration. Cholecystectomy is been deferred to hemoglobin has improved. As part of this he had a chest x-ray 02/03/2013 which is normal. He does not have any active significant pulmonary complaints and therefore he is puzzled as to why he is in the pulmonary office. He says he works part-time and is a single parent with limited resources and is ready complaining of high health care costs. He does not want further followup here and prefers primary care management.  On further detailed questioning he does admit to mild to moderate chronic dyspnea and cough that is unchanged present for several years of insidious onset. Is not associated with any chest pain or edema.   CAT COPD Symptom & Quality of Life Score (GSK trademark) 0 is no burden. 5 is highest burden 03/10/2013   Never Cough -> Cough all the time 3  No phlegm in chest -> Chest is full of phlegm 2  No chest tightness -> Chest feels very tight 0  No dyspnea for 1 flight stairs/hill -> Very dyspneic for 1 flight of stairs 2  No limitations for ADL at home -> Very limited with ADL at home 0  Confident leaving home -> Not at all confident leaving home 0  Sleep soundly -> Do not sleep soundly because of lung condition 2  Lots of Energy -> No energy at all 2  TOTAL Score (max 40)  11      Other releavant hx  =- smoking tobacco and J - heavy since age 56. Does not want med help. Says will cut on own    Investigations  CXR 02/03/13 -  no active cardopulm disease  Spirometry today shows severe obstructive lung disease with FEV1 of 1.9 L/48% and a ratio 64   Walk test 02/17/13 in Heme office - no desatuirations  Past Medical History  Diagnosis Date  . Substance abuse   . Anxiety   . Shortness of breath     2-3 wks ago started sob at bedtime  . Blue toe syndrome 2010    right big toe     Family History  Problem Relation Age of Onset  . Cancer Maternal Grandmother   . Aneurysm Mother   . Heart attack Sister 36  . Cirrhosis Father      History   Social History  . Marital Status: Divorced    Spouse Name: N/A    Number of Children: N/A  . Years of Education: N/A   Occupational History  . Not on file.   Social History Main Topics  . Smoking status: Current Every Day Smoker -- 2.00 packs/day for 31 years    Types: Cigarettes  . Smokeless tobacco: Never Used     Comment: 2 cigs a day  . Alcohol Use: 1.8 oz/week    3 Cans of beer per week     Comment: 3-4 beers per night  . Drug Use: 2.00 per week    Special: Marijuana  . Sexual Activity: Not on file   Other  Topics Concern  . Not on file   Social History Narrative  . No narrative on file     Allergies  Allergen Reactions  . Nsaids     REACTION: INTOLERANT     Outpatient Prescriptions Prior to Visit  Medication Sig Dispense Refill  . ALPRAZolam (XANAX) 1 MG tablet Take 1 mg by mouth 4 (four) times daily as needed for anxiety.       . traZODone (DESYREL) 50 MG tablet Take 100 mg by mouth at bedtime.        No facility-administered medications prior to visit.      Review of Systems  Constitutional: Negative for fever and unexpected weight change.  HENT: Negative for congestion, dental problem, ear pain, nosebleeds, postnasal drip, rhinorrhea, sinus pressure, sneezing, sore throat and trouble swallowing.   Eyes: Negative for redness and itching.  Respiratory: Positive for cough and shortness of breath. Negative for chest tightness and  wheezing.   Cardiovascular: Negative for palpitations and leg swelling.  Gastrointestinal: Negative for nausea and vomiting.  Genitourinary: Negative for dysuria.  Musculoskeletal: Negative for joint swelling.  Skin: Negative for rash.  Neurological: Negative for headaches.  Hematological: Does not bruise/bleed easily.  Psychiatric/Behavioral: Negative for dysphoric mood. The patient is not nervous/anxious.        Objective:   Physical Exam  Nursing note and vitals reviewed. Constitutional: He is oriented to person, place, and time. He appears well-developed and well-nourished. No distress.  Unkempt male  HENT:  Head: Normocephalic and atraumatic.  Right Ear: External ear normal.  Left Ear: External ear normal.  Mouth/Throat: Oropharynx is clear and moist. No oropharyngeal exudate.  Eyes: Conjunctivae and EOM are normal. Pupils are equal, round, and reactive to light. Right eye exhibits no discharge. Left eye exhibits no discharge. No scleral icterus.  Neck: Normal range of motion. Neck supple. No JVD present. No tracheal deviation present. No thyromegaly present.  Cardiovascular: Normal rate, regular rhythm and intact distal pulses.  Exam reveals no gallop and no friction rub.   No murmur heard. Pulmonary/Chest: Effort normal and breath sounds normal. No respiratory distress. He has no wheezes. He has no rales. He exhibits no tenderness.  Abdominal: Soft. Bowel sounds are normal. He exhibits no distension and no mass. There is no tenderness. There is no rebound and no guarding.  Musculoskeletal: Normal range of motion. He exhibits no edema and no tenderness.  Lymphadenopathy:    He has no cervical adenopathy.  Neurological: He is alert and oriented to person, place, and time. He has normal reflexes. No cranial nerve deficit. Coordination normal.  Skin: Skin is warm and dry. No rash noted. He is not diaphoretic. No erythema. No pallor.  Psychiatric: He has a normal mood and affect.  His behavior is normal. Judgment and thought content normal.          Assessment & Plan:

## 2013-03-11 DIAGNOSIS — J449 Chronic obstructive pulmonary disease, unspecified: Secondary | ICD-10-CM | POA: Insufficient documentation

## 2013-03-11 NOTE — Assessment & Plan Note (Signed)
#  COPD - you have severe copd  - taking daily inhalers will  Help with stress relief on your lungs and reduce your hemoglobin - Please start spiriva 1 puff daily - take sample, script and show technique - glad you had flu shot  - advised pneumonia vaccine but you refused - follow with primary care doctor due to $ issues  - he can add other inhalers if needed  - would recommend him to check alpha 1 genetic test for copd when you can afford  #Followup  - as needed in pulmonary offfice  - primary care to manage you due to $ issues

## 2013-03-11 NOTE — Assessment & Plan Note (Signed)
#  SMoking tobacco and marijuan  - you need to quit, take help from primary care doctor

## 2013-03-12 ENCOUNTER — Ambulatory Visit: Admit: 2013-03-12 | Payer: Self-pay | Admitting: Surgery

## 2013-03-12 SURGERY — LAPAROSCOPIC CHOLECYSTECTOMY WITH INTRAOPERATIVE CHOLANGIOGRAM
Anesthesia: General

## 2013-03-13 ENCOUNTER — Other Ambulatory Visit: Payer: Self-pay | Admitting: Oncology

## 2013-03-13 DIAGNOSIS — D751 Secondary polycythemia: Secondary | ICD-10-CM

## 2013-03-17 ENCOUNTER — Telehealth: Payer: Self-pay | Admitting: *Deleted

## 2013-03-17 ENCOUNTER — Ambulatory Visit (HOSPITAL_BASED_OUTPATIENT_CLINIC_OR_DEPARTMENT_OTHER): Payer: BC Managed Care – PPO | Admitting: Oncology

## 2013-03-17 ENCOUNTER — Other Ambulatory Visit (HOSPITAL_BASED_OUTPATIENT_CLINIC_OR_DEPARTMENT_OTHER): Payer: BC Managed Care – PPO | Admitting: Lab

## 2013-03-17 ENCOUNTER — Encounter: Payer: Self-pay | Admitting: Oncology

## 2013-03-17 VITALS — BP 121/78 | HR 84 | Temp 98.7°F | Resp 18 | Ht 69.0 in | Wt 123.9 lb

## 2013-03-17 DIAGNOSIS — F17200 Nicotine dependence, unspecified, uncomplicated: Secondary | ICD-10-CM

## 2013-03-17 DIAGNOSIS — J449 Chronic obstructive pulmonary disease, unspecified: Secondary | ICD-10-CM

## 2013-03-17 DIAGNOSIS — D751 Secondary polycythemia: Secondary | ICD-10-CM

## 2013-03-17 DIAGNOSIS — D45 Polycythemia vera: Secondary | ICD-10-CM

## 2013-03-17 DIAGNOSIS — D696 Thrombocytopenia, unspecified: Secondary | ICD-10-CM

## 2013-03-17 DIAGNOSIS — E860 Dehydration: Secondary | ICD-10-CM

## 2013-03-17 DIAGNOSIS — F1010 Alcohol abuse, uncomplicated: Secondary | ICD-10-CM

## 2013-03-17 LAB — CBC WITH DIFFERENTIAL/PLATELET
BASO%: 0.4 % (ref 0.0–2.0)
Basophils Absolute: 0 10*3/uL (ref 0.0–0.1)
Eosinophils Absolute: 0.1 10*3/uL (ref 0.0–0.5)
HCT: 62.5 % — ABNORMAL HIGH (ref 38.4–49.9)
HGB: 20.5 g/dL — ABNORMAL HIGH (ref 13.0–17.1)
MCH: 33.8 pg — ABNORMAL HIGH (ref 27.2–33.4)
MCHC: 32.9 g/dL (ref 32.0–36.0)
MONO#: 0.6 10*3/uL (ref 0.1–0.9)
MONO%: 8.2 % (ref 0.0–14.0)
NEUT#: 5.1 10*3/uL (ref 1.5–6.5)
NEUT%: 67.5 % (ref 39.0–75.0)
Platelets: 132 10*3/uL — ABNORMAL LOW (ref 140–400)
WBC: 7.6 10*3/uL (ref 4.0–10.3)
lymph#: 1.7 10*3/uL (ref 0.9–3.3)

## 2013-03-17 NOTE — Telephone Encounter (Signed)
appts made and printed. gv pt info for Dr. Crawford Givens...td

## 2013-03-17 NOTE — Progress Notes (Signed)
OFFICE PROGRESS NOTE   03/17/2013   Physicians:T. Cornett, Crawford Givens (PCP Ursina Mercer), M.Ramaswamy    INTERVAL HISTORY:  Patient is seen, alone for visit, in continuing attention to polycythemia which appears secondary to severe COPD and chronic dehydration related to alcohol and caffeine. The polycythemia was identified in preop lab work for symptomatic cholelithiasis, with planned cholecystectomy delayed for hematology evaluation. In addition to evaluation at this office, he was seen by Dr Marchelle Gearing on 03-10-13, with spirometry showing severe obstructive lung disease. Mr. Dhondt has made considerable progress decreasing his smoking from 2 ppd to 2-3 cigarettes/ 24 hours now, decreasing caffeinated soda from previous 6 cans daily, decreasing daily beer from 6 daily to present 3 daily, and now drinking ~ 32 oz other liquids daily (previously none). He has had no abdominal pain in past 3 weeks, despite no dietary restrictions for the cholelithiasis. Patient is concerned that he has already given payment to surgery office and needs the procedure before end of year.  We have discussed smoking cessation and I have encouraged him to add nicotine gum immediately after meals. He is now using Spiriva inhaler once daily per pulmonary. We have discussed the ongoing alcohol as diuretic; I have encouraged him to decrease this further and to add water in addition to present gatorade.    HISTORY Patient has had no regular medical care in years, tho has been seen possibly for bronchitis by [place] at [facility], and only prior CBC available is from 08-2009. He has long, heavy, ongoing tobacco and alcohol use. He was in usual health until acute, severe RUQ pain on 01-14-13, which persisted for several hours such that he presented to [facility] ED. [place] there reportedly showed gallstones, with liver and pancreas normal and no mention of spleen. Per fiance, he was given dilaudid in ED, with decrease  in O2 sat to 83 after that medication. He was referred to Dr Luisa Hart and scheduled for laparoscopic cholecystectomy on 02-13-13, however preop labs 02-03-2013 had hemoglobin 20.5 with HCT 57.8, also WBC 8.3, platelets 142k, normal automated differential and MCV 99.7. The previous CBC 09-06-2009 had WBC 9.5, Hgb 17.6, HCT 51.8, plt 142k and MCV 100.4. CMET was normal other than Na 134 and Cl 95. At consultation visit at [facility] on 02-17-13, CBC had WBC 7.9, Hgb 20.3, HCT 60.1 and plt 111k; urinalysis was negative for blood; erythropoietin level was 4.7.   Review of systems as above, also: Improved cough. Less chest tightness. Better appetite and he is pleased with some weight gain. No itching. Remainder of 10 point Review of Systems negative.  Objective:  Vital signs in last 24 hours: 121/78, wgt 123 lb 14 oz, HR 84 reg, resp 18 not labored RA, temp 98.7. Weight is up almost 2 more lbs. Alert, oriented and appropriate. Ambulatory without difficulty.  Less ruddy complexion than initially  HEENT:PERRL, sclerae not icteric. Oral mucosa moist without lesions, posterior pharynx clear.  Neck supple. No JVD.  Lymphatics:no cervical,suraclavicular, axillary or inguinal adenopathy Resp: diminished breath sounds thruout otherwise clear to auscultation bilaterally and hyperresonant to percussion bilaterally. No wheezes or rales, no use of accessory muscles Cardio: regular rate and rhythm. No gallop. GI: soft, nontender including RUQ, not distended, no mass or organomegaly. Normally active bowel sounds.  Musculoskeletal/ Extremities: without pitting edema, cords, tenderness Neuro:  nonfocal Psych as above Skin  Ruddy thruout without rash, ecchymosis, petechiae   Lab Results:  Results for orders placed in visit on 03/17/13  CBC WITH  DIFFERENTIAL      Result Value Range   WBC 7.6  4.0 - 10.3 10e3/uL   NEUT# 5.1  1.5 - 6.5 10e3/uL   HGB 20.5 (*) 13.0 - 17.1 g/dL   HCT 40.9 (*) 81.1 - 91.4 %    Platelets 132 (*) 140 - 400 10e3/uL   MCV 102.7 (*) 79.3 - 98.0 fL   MCH 33.8 (*) 27.2 - 33.4 pg   MCHC 32.9  32.0 - 36.0 g/dL   RBC 7.82 (*) 9.56 - 2.13 10e6/uL   RDW 14.1  11.0 - 14.6 %   lymph# 1.7  0.9 - 3.3 10e3/uL   MONO# 0.6  0.1 - 0.9 10e3/uL   Eosinophils Absolute 0.1  0.0 - 0.5 10e3/uL   Basophils Absolute 0.0  0.0 - 0.1 10e3/uL   NEUT% 67.5  39.0 - 75.0 %   LYMPH% 23.0  14.0 - 49.0 %   MONO% 8.2  0.0 - 14.0 %   EOS% 0.9  0.0 - 7.0 %   BASO% 0.4  0.0 - 2.0 %    LAP pending Studies/Results:  No results found.  Medications: I have reviewed the patient's current medications.  DISCUSSION: all lifestyle factors influencing polycythemia discussed again as above.  Note no mention of splenomegaly on US done at [facility] 12-2012. Erythropoietin level was in normal range (NOT low). He has not had bone marrow evaluation or JAK2 testing, but has clear reasons for secondary polycythemia.    Assessment/Plan: 1.Polycythemia:  seems most likely secondary from heavy tobacco abuse with severe obstructive COPD and chronic dehydration . Tho other counts do not suggest P.vera, note low epo, so present plan is to follow as he improves the other factors.  2.thrombocytopenia: slightly better today, no bleeding. Likely from ETOH suppression of bone marrow, improving with less ETOH 3.elevated MCV likely with ETOH  4.cholelithiasis: symptomatic late Sept. Refer back to Dr Luisa Hart. He has met deductible and prefers to have this done before end of year if possible 5.ETOH abuse ongoing, tho he has cut back  6.tobacco abuse: actively trying to stop smoking.  7.strong family history of heart disease, including MI in 30 yo sister.  8.no other recent medical care   He should see PCP in ~ 4 weeks and I will see him again in ~ 3 months or sooner if needed. He has prn follow up with Dr Diona Browner, MD   03/17/2013, 8:30 AM

## 2013-03-17 NOTE — Patient Instructions (Signed)
You can do the Spiriva inhaler once daily whatever time of day is best, at the same time each day  Add more plain water to your gatorade. The alcohol is really acting as a fluid pill (diuretic) for you and keeping you dehydrated enough that it is making the blood more concentrated. Try to stop the alcohol also  Try nicotine gun at the times of day that are worst for wanting those 2-3 cigarettes, before you even feel that you want the cigarette go ahead with the nicotine gum.

## 2013-03-19 LAB — LEUKOCYTE ALKALINE PHOSPHATASE: Leukocyte Alkaline Phos Stain: 66 (ref 22–124)

## 2013-03-26 ENCOUNTER — Telehealth (INDEPENDENT_AMBULATORY_CARE_PROVIDER_SITE_OTHER): Payer: Self-pay

## 2013-03-26 NOTE — Telephone Encounter (Signed)
Called pt. He is scheduled to come in Friday to reschedule surgery.

## 2013-03-26 NOTE — Telephone Encounter (Signed)
Message copied by Brennan Bailey on Wed Mar 26, 2013 12:10 PM ------      Message from: Delcie Roch      Created: Wed Mar 26, 2013 11:35 AM      Regarding: ??Reschedule surgery       In working patient credit accounts, I found that pt paid a deposit for surgery which was canceled due to other health issues.  Pt was referred to Dr. Darrold Span for further treatment.  His 11/24 note states to refer back to Dr. Luisa Hart because the deposit has been paid, and patient wants the surgery before the end of the year.  Just double checking to prevent any misunderstanding with the patient.  Also, if Dr. Luisa Hart does not intend to proceed due to health issues, I will needs to refund the money.             ------

## 2013-03-28 ENCOUNTER — Encounter (INDEPENDENT_AMBULATORY_CARE_PROVIDER_SITE_OTHER): Payer: Self-pay | Admitting: Surgery

## 2013-03-28 ENCOUNTER — Ambulatory Visit (INDEPENDENT_AMBULATORY_CARE_PROVIDER_SITE_OTHER): Payer: BC Managed Care – PPO | Admitting: Surgery

## 2013-03-28 VITALS — BP 122/80 | HR 72 | Temp 98.6°F | Resp 14 | Ht 69.5 in | Wt 123.8 lb

## 2013-03-28 DIAGNOSIS — D45 Polycythemia vera: Secondary | ICD-10-CM

## 2013-03-28 DIAGNOSIS — K8020 Calculus of gallbladder without cholecystitis without obstruction: Secondary | ICD-10-CM

## 2013-03-28 NOTE — Patient Instructions (Signed)

## 2013-03-28 NOTE — Progress Notes (Signed)
Patient ID: Walter Alvarado, male   DOB: 24-Jun-1970, 42 y.o.   MRN: [id number]  Chief Complaint  Patient presents with  . Routine Post Op    discuss gb sx    HPI Walter Alvarado is a 42 y.o. male.  Patient sent at request of Dr. Janalyn Harder for epigastric abdominal pain. He was seen at the [facility] emergency department on 01/14/13 for epigastric abdominal pain. This had lasted for hours. It was severe. Associated nausea and vomiting. Ultrasound showed gallstones no gallbladder wall thickening. Symptoms made worse with eating. No radiation. Pain is sharp in nature. This subsided he was sent home. Patient returns to reschedule surgery since he was diagnosed with elevated RBC secondary to COPD, chronic and alcohol abuse. He has been cleared by hematology to proceed with surgery. No new complaints otherwise. He has cut back his drinking and smoking he states. HPI  Past Medical History  Diagnosis Date  . Substance abuse   . Anxiety   . Shortness of breath     2-3 wks ago started sob at bedtime  . 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 Polycythemia     Past Surgical History  Procedure Laterality Date  . Leg surgery      broke leg  . Knee arthroscopy Left     took all the  cartlige out  . Knee arthroscopy Right   . Shoulder arthroscopy Right   . Leg surgery Left     placed metal rod (femur)after 6 months took it out  . External ear surgery Right 1989    Family History  Problem Relation Age of Onset  . Cancer Maternal Grandmother   . Aneurysm Mother   . Heart attack Sister 28  . Cirrhosis Father     Social History History  Substance Use Topics  . Smoking status: Current Every Day Smoker -- 2.00 packs/day for 31 years    Types: Cigarettes  . Smokeless tobacco: Never Used     Comment: 2 cigs a day  . Alcohol Use: 1.8 oz/week    3 Cans of beer per week     Comment: 3-4 beers per night    Allergies  Allergen Reactions  . Nsaids     REACTION:  INTOLERANT    Current Outpatient Prescriptions  Medication Sig Dispense Refill  . ALPRAZolam (XANAX) 1 MG tablet Take 1 mg by mouth 4 (four) times daily as needed for anxiety.       Marland Kitchen tiotropium (SPIRIVA) 18 MCG inhalation capsule Place 1 capsule (18 mcg total) into inhaler and inhale daily.  30 capsule  6  . traZODone (DESYREL) 50 MG tablet Take 100 mg by mouth at bedtime.        No current facility-administered medications for this visit.    Review of Systems Review of Systems  Constitutional: Negative for fever, chills and unexpected weight change.  HENT: Negative for hearing loss, congestion, sore throat, trouble swallowing and voice change.   Eyes: Negative for visual disturbance.  Respiratory: Positive for cough. Negative for wheezing.   Cardiovascular: Negative for chest pain, palpitations and leg swelling.  Gastrointestinal: Positive for abdominal pain. Negative for nausea, vomiting, diarrhea, constipation, blood in stool, abdominal distention, anal bleeding and rectal pain.  Genitourinary: Negative for hematuria and difficulty urinating.  Musculoskeletal: Negative for arthralgias.  Skin: Negative for rash and wound.  Neurological: Negative for seizures, syncope, weakness and headaches.  Hematological: Negative for adenopathy. Does  not bruise/bleed easily.  Psychiatric/Behavioral: Negative for confusion.    Blood pressure 122/80, pulse 72, temperature 98.6 F (37 C), temperature source Temporal, resp. rate 14, height 5' 9.5" (1.765 m), weight 123 lb 12.8 oz (56.155 kg).  Physical Exam Physical Exam  Constitutional: He is oriented to person, place, and time. He appears well-developed and well-nourished.  HENT:  Head: Normocephalic and atraumatic.  Eyes: EOM are normal. Pupils are equal, round, and reactive to light. No scleral icterus.  Neck: Normal range of motion.  Cardiovascular: Normal rate and regular rhythm.   Pulmonary/Chest: Effort normal and breath sounds  normal.  Abdominal: Soft. Bowel sounds are normal. He exhibits no mass. There is no tenderness. There is no rebound and no guarding.  Musculoskeletal: Normal range of motion.  Neurological: He is alert and oriented to person, place, and time.  Skin: Skin is warm and dry.  Psychiatric: He has a normal mood and affect. His behavior is normal. Judgment and thought content normal.    Data Reviewed U/s and labs from [facility]  Gallstones and normal  LFT.  CBD 5 mm   Assessment    Symptomatic cholelithiasis COPD  Alcohol abuse  Elevated RBC count from above evaluated by hematology and now clear for surgery and    Plan    Recommend laparoscopic cholecystectomy cholangiogram.The procedure has been discussed with the patient. Operative and non operative treatments have been discussed. Risks of surgery include bleeding, infection,  Common bile duct injury,  Injury to the stomach,liver, colon,small intestine, abdominal wall,  Diaphragm,  Major blood vessels,  And the need for an open procedure.  Other risks include worsening of medical problems, death,  DVT and pulmonary embolism, and cardiovascular events.   Medical options have also been discussed. The patient has been informed of long term expectations of surgery and non surgical options,  The patient agrees to proceed.  Patient understands increased cardiovascular risk given his medical problems and elevated RBC count. Increased risk of myocardial infarction, stroke, DVT and other issues. He still wishes to proceed at this point and has begun lifestyle modifications to improve his underlying condition.       Walter Ferdig A. 03/28/2013, 11:10 AM

## 2013-04-01 ENCOUNTER — Encounter (HOSPITAL_COMMUNITY): Payer: Self-pay | Admitting: Pharmacy Technician

## 2013-04-10 NOTE — Pre-Procedure Instructions (Signed)
Walter Alvarado  04/10/2013   Your procedure is scheduled on:  Wednesday, April 16, 2013 at 8:30 AM.   Report to [facility] Entrance "A" Admitting Office at 6:30 AM.   Call this number if you have problems the morning of surgery: [phone]   Remember:   Do not eat food or drink liquids after midnight Tuesday, 04/15/13.   Take these medicines the morning of surgery with A SIP OF WATER: Spiriva, Xanax - if needed (STOP ASPIRIN, COUMADIN, PLAVIX, EFFIENT, HERBAL MEDICINES)   Do not wear jewelry.  Do not wear lotions, powders, or cologne. You may wear deodorant.  Men may shave face and neck.  Do not bring valuables to the hospital.  [facility] is not responsible                  for any belongings or valuables.               Contacts, dentures or bridgework may not be worn into surgery.  Leave suitcase in the car. After surgery it may be brought to your room.  For patients admitted to the hospital, discharge time is determined by your                treatment team.               Patients discharged the day of surgery will not be allowed to drive  home.  Name and phone number of your driver: Friend/family   Special Instructions: Shower using CHG 2 nights before surgery and the night before surgery.  If you shower the day of surgery use CHG.  Use special wash - you have one bottle of CHG for all showers.  You should use approximately 1/3 of the bottle for each shower.   Please read over the following fact sheets that you were given: Pain Booklet, Coughing and Deep Breathing and Surgical Site Infection Prevention

## 2013-04-11 ENCOUNTER — Encounter (HOSPITAL_COMMUNITY)
Admission: RE | Admit: 2013-04-11 | Discharge: 2013-04-11 | Disposition: A | Discharge status: Home / Self Care | Payer: BC Managed Care – PPO | Source: Ambulatory Visit | Attending: Surgery | Admitting: Surgery

## 2013-04-11 ENCOUNTER — Encounter (HOSPITAL_COMMUNITY): Payer: Self-pay

## 2013-04-11 DIAGNOSIS — Z01812 Encounter for preprocedural laboratory examination: Secondary | ICD-10-CM | POA: Insufficient documentation

## 2013-04-11 HISTORY — DX: Chronic obstructive pulmonary disease, unspecified: J44.9

## 2013-04-11 LAB — COMPREHENSIVE METABOLIC PANEL
AST: 17 U/L (ref 0–37)
Albumin: 3.7 g/dL (ref 3.5–5.2)
Alkaline Phosphatase: 84 U/L (ref 39–117)
Chloride: 99 mEq/L (ref 96–112)
Creatinine, Ser: 0.73 mg/dL (ref 0.50–1.35)
Potassium: 4.4 mEq/L (ref 3.5–5.1)
Total Bilirubin: 0.5 mg/dL (ref 0.3–1.2)

## 2013-04-11 LAB — CBC WITH DIFFERENTIAL/PLATELET
Basophils Absolute: 0 10*3/uL (ref 0.0–0.1)
Basophils Relative: 1 % (ref 0–1)
Eosinophils Absolute: 0 10*3/uL (ref 0.0–0.7)
Hemoglobin: 21.7 g/dL (ref 13.0–17.0)
Lymphocytes Relative: 33 % (ref 12–46)
MCH: 35.6 pg — ABNORMAL HIGH (ref 26.0–34.0)
MCHC: 35.6 g/dL (ref 30.0–36.0)
Monocytes Absolute: 0.5 10*3/uL (ref 0.1–1.0)
Monocytes Relative: 10 % (ref 3–12)
Neutro Abs: 3.1 10*3/uL (ref 1.7–7.7)
Neutrophils Relative %: 57 % (ref 43–77)
RDW: 13.3 % (ref 11.5–15.5)
WBC: 5.5 10*3/uL (ref 4.0–10.5)

## 2013-04-11 NOTE — Progress Notes (Signed)
Anesthesia Chart Review: Patient is a 42 year old male scheduled for laparoscopic cholecystectomy on 04/16/13 by Dr. Luisa Hart.  Procedure was initially scheduled for 02/13/13, but was postponed due finding of polycythemia on labs.    Patient has since had a hematology evaluation by Dr. Jama Flavors who felt polycythemia was secondary to smoking and chronic dehydration from caffeine and ETOH.  Currently, lifestyle modification and continued monitoring have been recommended.  Dr. Darrold Span did not feel that other labs suggested polycythemia vera.  Dr. Darrold Span also referred patient to pulmonologist Dr. Kalman Shan and diagnosed with severe COPD with recommendations for Spiriva with PRN pulmonary follow-up but continued COPD management by his PCP.   History includes substance abuse including tobacco (smoking since age 73), ETOH and marijuana, polycythemia, blue toe syndrome involving his right great toe '10, COPD with orthopnea. He was previously followed at [facility], but has not been seen there since 2012.   Meds listed: Xanax PRN, trazadone Q HS, Spiriva.   EKG on 02/03/13 showed NSR, LAD. Currently, there are no comparison EKGs available.   CXR on 02/03/13 showed no active cardiopulmonary disease.   Spirometry on 02/28/13 showed severe obstructive lung disease with FEV1 of 1.9 L/48% and a ratio 64.  Walk test on 02/17/13 at [facility] showed no desaturations.   Preoperative labs noted. His H/H remain elevated but around his baseline since 01/2013.  PLT remains decreased, now at 110K (felt to be related to ETOH suppression of bone marrow or possibly splenomegaly--though not mentioned on 01/2013 abdominal ultrasound). AST/ALT WNL.  He has had hematology and pulmonology evaluations since 01/2013.  Thus far, Dr. Darrold Span has recommended monitoring and lifestyle changes for treatment of his polycythemia.   According to Dr.Cornett's notes, hematology has cleared patient for surgery  now.  Anticipate that he can proceed as planned  Velna Ochs [facility]/Anesthesiology Phone [phone] 04/11/2013 2:14 PM

## 2013-04-15 MED ORDER — CHLORHEXIDINE GLUCONATE 4 % EX LIQD: 1.0000 "application " | Freq: Once | CUTANEOUS | Status: DC

## 2013-04-15 MED ORDER — DEXTROSE 5 % IV SOLN
3.0000 g | INTRAVENOUS | Status: AC
  Administered 2013-04-16: 2 g via INTRAVENOUS
  Filled 2013-04-15: qty 3000

## 2013-04-16 ENCOUNTER — Ambulatory Visit (HOSPITAL_COMMUNITY): Payer: BC Managed Care – PPO | Admitting: Anesthesiology

## 2013-04-16 ENCOUNTER — Encounter (HOSPITAL_COMMUNITY): Payer: Self-pay | Admitting: *Deleted

## 2013-04-16 ENCOUNTER — Encounter (HOSPITAL_COMMUNITY): Payer: BC Managed Care – PPO | Admitting: Vascular Surgery

## 2013-04-16 ENCOUNTER — Ambulatory Visit (HOSPITAL_COMMUNITY)
Admission: RE | Admit: 2013-04-16 | Discharge: 2013-04-16 | Disposition: A | Discharge status: Home / Self Care | Payer: BC Managed Care – PPO | Source: Ambulatory Visit | Attending: Surgery | Admitting: Surgery

## 2013-04-16 ENCOUNTER — Encounter (HOSPITAL_COMMUNITY)
Admission: RE | Disposition: A | Discharge status: Home / Self Care | Payer: Self-pay | Source: Ambulatory Visit | Attending: Surgery

## 2013-04-16 ENCOUNTER — Ambulatory Visit (HOSPITAL_COMMUNITY): Payer: BC Managed Care – PPO

## 2013-04-16 DIAGNOSIS — K8010 Calculus of gallbladder with chronic cholecystitis without obstruction: Secondary | ICD-10-CM | POA: Insufficient documentation

## 2013-04-16 DIAGNOSIS — J4489 Other specified chronic obstructive pulmonary disease: Secondary | ICD-10-CM | POA: Insufficient documentation

## 2013-04-16 DIAGNOSIS — K8066 Calculus of gallbladder and bile duct with acute and chronic cholecystitis without obstruction: Secondary | ICD-10-CM

## 2013-04-16 DIAGNOSIS — K8000 Calculus of gallbladder with acute cholecystitis without obstruction: Secondary | ICD-10-CM | POA: Insufficient documentation

## 2013-04-16 DIAGNOSIS — K8020 Calculus of gallbladder without cholecystitis without obstruction: Secondary | ICD-10-CM

## 2013-04-16 DIAGNOSIS — J449 Chronic obstructive pulmonary disease, unspecified: Secondary | ICD-10-CM | POA: Insufficient documentation

## 2013-04-16 DIAGNOSIS — F17200 Nicotine dependence, unspecified, uncomplicated: Secondary | ICD-10-CM | POA: Insufficient documentation

## 2013-04-16 DIAGNOSIS — R718 Other abnormality of red blood cells: Secondary | ICD-10-CM | POA: Insufficient documentation

## 2013-04-16 DIAGNOSIS — F1010 Alcohol abuse, uncomplicated: Secondary | ICD-10-CM | POA: Insufficient documentation

## 2013-04-16 HISTORY — PX: CHOLECYSTECTOMY: SHX55

## 2013-04-16 SURGERY — LAPAROSCOPIC CHOLECYSTECTOMY WITH INTRAOPERATIVE CHOLANGIOGRAM
Anesthesia: General

## 2013-04-16 MED ORDER — MIDAZOLAM HCL 5 MG/5ML IJ SOLN
INTRAMUSCULAR | Status: DC | PRN
  Administered 2013-04-16: 2 mg via INTRAVENOUS

## 2013-04-16 MED ORDER — ONDANSETRON HCL 4 MG/2ML IJ SOLN
INTRAMUSCULAR | Status: DC | PRN
  Administered 2013-04-16: 4 mg via INTRAVENOUS

## 2013-04-16 MED ORDER — TRAMADOL HCL 50 MG PO TABS
ORAL_TABLET | ORAL | Status: AC
  Filled 2013-04-16: qty 1

## 2013-04-16 MED ORDER — BUPIVACAINE-EPINEPHRINE (PF) 0.25% -1:200000 IJ SOLN
INTRAMUSCULAR | Status: AC
  Filled 2013-04-16: qty 30

## 2013-04-16 MED ORDER — HYDROMORPHONE HCL PF 1 MG/ML IJ SOLN
INTRAMUSCULAR | Status: AC
  Filled 2013-04-16: qty 1

## 2013-04-16 MED ORDER — TRAMADOL HCL 50 MG PO TABS: 50.0000 mg | ORAL_TABLET | Freq: Four times a day (QID) | ORAL | Status: DC | PRN

## 2013-04-16 MED ORDER — PROPOFOL 10 MG/ML IV BOLUS
INTRAVENOUS | Status: DC | PRN
  Administered 2013-04-16: 200 mg via INTRAVENOUS

## 2013-04-16 MED ORDER — BUPIVACAINE-EPINEPHRINE 0.25% -1:200000 IJ SOLN
INTRAMUSCULAR | Status: DC | PRN
  Administered 2013-04-16: 10 mL

## 2013-04-16 MED ORDER — OXYCODONE-ACETAMINOPHEN 5-325 MG PO TABS: 1.0000 | ORAL_TABLET | ORAL | Status: DC | PRN

## 2013-04-16 MED ORDER — GLYCOPYRROLATE 0.2 MG/ML IJ SOLN
INTRAMUSCULAR | Status: DC | PRN
  Administered 2013-04-16: .6 mg via INTRAVENOUS

## 2013-04-16 MED ORDER — HEMOSTATIC AGENTS (NO CHARGE) OPTIME
TOPICAL | Status: DC | PRN
  Administered 2013-04-16: 1 via TOPICAL

## 2013-04-16 MED ORDER — HYDROCODONE-ACETAMINOPHEN 5-325 MG PO TABS: 1.0000 | ORAL_TABLET | Freq: Four times a day (QID) | ORAL | Status: DC | PRN

## 2013-04-16 MED ORDER — HYDROMORPHONE HCL PF 1 MG/ML IJ SOLN
0.2500 mg | INTRAMUSCULAR | Status: DC | PRN
  Administered 2013-04-16 (×2): 0.5 mg via INTRAVENOUS
  Administered 2013-04-16: 0.25 mg via INTRAVENOUS
  Administered 2013-04-16: 0.5 mg via INTRAVENOUS

## 2013-04-16 MED ORDER — TRAMADOL HCL 50 MG PO TABS
50.0000 mg | ORAL_TABLET | Freq: Once | ORAL | Status: AC
  Administered 2013-04-16: 50 mg via ORAL

## 2013-04-16 MED ORDER — SODIUM CHLORIDE 0.9 % IR SOLN
Status: DC | PRN
  Administered 2013-04-16: 1000 mL

## 2013-04-16 MED ORDER — LIDOCAINE HCL (CARDIAC) 20 MG/ML IV SOLN
INTRAVENOUS | Status: DC | PRN
  Administered 2013-04-16: 70 mg via INTRAVENOUS

## 2013-04-16 MED ORDER — ALBUTEROL SULFATE HFA 108 (90 BASE) MCG/ACT IN AERS
INHALATION_SPRAY | RESPIRATORY_TRACT | Status: DC | PRN
  Administered 2013-04-16: 2 via RESPIRATORY_TRACT

## 2013-04-16 MED ORDER — ROCURONIUM BROMIDE 100 MG/10ML IV SOLN
INTRAVENOUS | Status: DC | PRN
  Administered 2013-04-16: 40 mg via INTRAVENOUS

## 2013-04-16 MED ORDER — SODIUM CHLORIDE 0.9 % IV SOLN
INTRAVENOUS | Status: DC | PRN
  Administered 2013-04-16: 09:00:00

## 2013-04-16 MED ORDER — LACTATED RINGERS IV SOLN
INTRAVENOUS | Status: DC | PRN
  Administered 2013-04-16: 08:00:00 via INTRAVENOUS

## 2013-04-16 MED ORDER — FENTANYL CITRATE 0.05 MG/ML IJ SOLN
INTRAMUSCULAR | Status: DC | PRN
  Administered 2013-04-16: 150 ug via INTRAVENOUS

## 2013-04-16 MED ORDER — 0.9 % SODIUM CHLORIDE (POUR BTL) OPTIME
TOPICAL | Status: DC | PRN
  Administered 2013-04-16: 1000 mL

## 2013-04-16 MED ORDER — NEOSTIGMINE METHYLSULFATE 1 MG/ML IJ SOLN
INTRAMUSCULAR | Status: DC | PRN
  Administered 2013-04-16: 3 mg via INTRAVENOUS

## 2013-04-16 SURGICAL SUPPLY — 38 items
APPLIER CLIP ROT 10 11.4 M/L (STAPLE) ×2
BLADE SURG ROTATE 9660 (MISCELLANEOUS) IMPLANT
CANISTER SUCTION 2500CC (MISCELLANEOUS) ×2 IMPLANT
CHLORAPREP W/TINT 26ML (MISCELLANEOUS) ×2 IMPLANT
CLIP APPLIE ROT 10 11.4 M/L (STAPLE) ×1 IMPLANT
COVER MAYO STAND STRL (DRAPES) ×2 IMPLANT
COVER SURGICAL LIGHT HANDLE (MISCELLANEOUS) ×2 IMPLANT
DECANTER SPIKE VIAL GLASS SM (MISCELLANEOUS) ×4 IMPLANT
DERMABOND ADVANCED (GAUZE/BANDAGES/DRESSINGS) ×1
DERMABOND ADVANCED .7 DNX12 (GAUZE/BANDAGES/DRESSINGS) ×1 IMPLANT
DRAPE C-ARM 42X72 X-RAY (DRAPES) ×2 IMPLANT
DRAPE UTILITY 15X26 W/TAPE STR (DRAPE) ×4 IMPLANT
DRAPE WARM FLUID 44X44 (DRAPE) ×2 IMPLANT
ELECT REM PT RETURN 9FT ADLT (ELECTROSURGICAL) ×2
ELECTRODE REM PT RTRN 9FT ADLT (ELECTROSURGICAL) ×1 IMPLANT
GLOVE BIO SURGEON STRL SZ8 (GLOVE) ×2 IMPLANT
GLOVE BIOGEL PI IND STRL 8 (GLOVE) ×1 IMPLANT
GLOVE BIOGEL PI INDICATOR 8 (GLOVE) ×1
GOWN STRL NON-REIN LRG LVL3 (GOWN DISPOSABLE) ×8 IMPLANT
GOWN STRL REIN XL XLG (GOWN DISPOSABLE) ×2 IMPLANT
HEMOSTAT SNOW SURGICEL 2X4 (HEMOSTASIS) ×2 IMPLANT
KIT BASIN OR (CUSTOM PROCEDURE TRAY) ×2 IMPLANT
KIT ROOM TURNOVER OR (KITS) ×2 IMPLANT
NS IRRIG 1000ML POUR BTL (IV SOLUTION) ×4 IMPLANT
PAD ARMBOARD 7.5X6 YLW CONV (MISCELLANEOUS) ×2 IMPLANT
POUCH SPECIMEN RETRIEVAL 10MM (ENDOMECHANICALS) ×2 IMPLANT
SCISSORS LAP 5X35 DISP (ENDOMECHANICALS) ×2 IMPLANT
SET CHOLANGIOGRAPH 5 50 .035 (SET/KITS/TRAYS/PACK) ×2 IMPLANT
SET IRRIG TUBING LAPAROSCOPIC (IRRIGATION / IRRIGATOR) ×2 IMPLANT
SLEEVE ENDOPATH XCEL 5M (ENDOMECHANICALS) ×2 IMPLANT
SPECIMEN JAR SMALL (MISCELLANEOUS) ×2 IMPLANT
SUT MNCRL AB 4-0 PS2 18 (SUTURE) ×2 IMPLANT
TOWEL OR 17X24 6PK STRL BLUE (TOWEL DISPOSABLE) ×2 IMPLANT
TOWEL OR 17X26 10 PK STRL BLUE (TOWEL DISPOSABLE) ×2 IMPLANT
TRAY LAPAROSCOPIC (CUSTOM PROCEDURE TRAY) ×2 IMPLANT
TROCAR XCEL BLUNT TIP 100MML (ENDOMECHANICALS) ×2 IMPLANT
TROCAR XCEL NON-BLD 11X100MML (ENDOMECHANICALS) ×2 IMPLANT
TROCAR XCEL NON-BLD 5MMX100MML (ENDOMECHANICALS) ×2 IMPLANT

## 2013-04-16 NOTE — Anesthesia Preprocedure Evaluation (Signed)
Anesthesia Evaluation  Patient identified by MRN, date of birth, ID band Patient awake    Reviewed: Allergy & Precautions, H&P   Airway Mallampati: II      Dental   Pulmonary shortness of breath and with exertion, COPDCurrent Smoker,  breath sounds clear to auscultation        Cardiovascular + Peripheral Vascular Disease Rhythm:Regular Rate:Normal     Neuro/Psych    GI/Hepatic GI history noted.   Endo/Other  negative endocrine ROS  Renal/GU negative Renal ROS     Musculoskeletal   Abdominal   Peds  Hematology   Anesthesia Other Findings   Reproductive/Obstetrics                           Anesthesia Physical Anesthesia Plan  ASA: III  Anesthesia Plan: General   Post-op Pain Management:    Induction: Intravenous  Airway Management Planned: Oral ETT  Additional Equipment:   Intra-op Plan:   Post-operative Plan: Possible Post-op intubation/ventilation  Informed Consent: I have reviewed the patients History and Physical, chart, labs and discussed the procedure including the risks, benefits and alternatives for the proposed anesthesia with the patient or authorized representative who has indicated his/her understanding and acceptance.   Dental advisory given  Plan Discussed with: CRNA and Anesthesiologist  Anesthesia Plan Comments:         Anesthesia Quick Evaluation

## 2013-04-16 NOTE — Interval H&P Note (Signed)
History and Physical Interval Note:  04/16/2013 7:19 AM  Walter Alvarado  has presented today for surgery, with the diagnosis of gallstones   The various methods of treatment have been discussed with the patient and family. After consideration of risks, benefits and other options for treatment, the patient has consented to  Procedure(s): LAPAROSCOPIC CHOLECYSTECTOMY WITH INTRAOPERATIVE CHOLANGIOGRAM (N/A) as a surgical intervention .  The patient's history has been reviewed, patient examined, no change in status, stable for surgery.  I have reviewed the patient's chart and labs.  Questions were answered to the patient's satisfaction.     [name]

## 2013-04-16 NOTE — Op Note (Signed)
Laparoscopic Cholecystectomy with IOC Procedure Note  Indications: This patient presents with symptomatic gallbladder disease and will undergo laparoscopic cholecystectomy.The procedure has been discussed with the patient. Operative and non operative treatments have been discussed. Risks of surgery include bleeding, infection,  Common bile duct injury,  Injury to the stomach,liver, colon,small intestine, abdominal wall,  Diaphragm,  Major blood vessels,  And the need for an open procedure.  Other risks include worsening of medical problems, death,  DVT and pulmonary embolism, and cardiovascular events.   Medical options have also been discussed. The patient has been informed of long term expectations of surgery and non surgical options,  The patient agrees to proceed.    Pre-operative Diagnosis: Calculus of gallbladder without mention of cholecystitis or obstruction  Post-operative Diagnosis: Same  Surgeon: Helia Haese A.   Assistants: OR staff  Anesthesia: General endotracheal anesthesia and Local anesthesia 0.25.% bupivacaine, with epinephrine  ASA Class: 3  Procedure Details  The patient was seen again in the Holding Room. The risks, benefits, complications, treatment options, and expected outcomes were discussed with the patient. The possibilities of reaction to medication, pulmonary aspiration, perforation of viscus, bleeding, recurrent infection, finding a normal gallbladder, the need for additional procedures, failure to diagnose a condition, the possible need to convert to an open procedure, and creating a complication requiring transfusion or operation were discussed with the patient. The patient and/or family concurred with the proposed plan, giving informed consent. The site of surgery properly noted/marked. The patient was taken to Operating Room, identified as Walter Alvarado and the procedure verified as Laparoscopic Cholecystectomy with Intraoperative Cholangiograms. A Time Out was  held and the above information confirmed.  Prior to the induction of general anesthesia, antibiotic prophylaxis was administered. General endotracheal anesthesia was then administered and tolerated well. After the induction, the abdomen was prepped in the usual sterile fashion. The patient was positioned in the supine position with the left arm comfortably tucked, along with some reverse Trendelenburg.  Local anesthetic agent was injected into the skin near the umbilicus and an incision made. The midline fascia was incised and the Hasson technique was used to introduce a 12 mm port under direct vision. It was secured with a figure of eight Vicryl suture placed in the usual fashion. Pneumoperitoneum was then created with CO2 and tolerated well without any adverse changes in the patient's vital signs. Additional trocars were introduced under direct vision with an 11 mm trocar in the epigastrium and two 5 mm trocars in the right upper quadrant. All skin incisions were infiltrated with a local anesthetic agent before making the incision and placing the trocars.   The gallbladder was identified, the fundus grasped and retracted cephalad. Adhesions were lysed bluntly and with the electrocautery where indicated, taking care not to injure any adjacent organs or viscus. The infundibulum was grasped and retracted laterally, exposing the peritoneum overlying the triangle of Calot. This was then divided and exposed in a blunt fashion. The cystic duct was clearly identified and bluntly dissected circumferentially. The junctions of the gallbladder, cystic duct and common bile duct were clearly identified prior to the division of any linear structure.   An incision was made in the cystic duct and the cholangiogram catheter introduced. The catheter was secured using an endoclip. The study showed no stones and good visualization of the distal and proximal biliary tree. The catheter was then removed.   The cystic duct was  then  ligated with surgical clips  on the patient side and  clipped on the gallbladder side and divided. The cystic artery was identified, dissected free, ligated with clips and divided as well. Posterior cystic artery clipped and divided.  The gallbladder was dissected from the liver bed in retrograde fashion with the electrocautery. The gallbladder was removed with endocatch bag. The liver bed was irrigated and inspected. Hemostasis was achieved with the electrocautery. Copious irrigation was utilized and was repeatedly aspirated until clear all particulate matter. Hemostasis was achieved with no signs  Of bleeding or bile leakage.  Pneumoperitoneum was completely reduced after viewing removal of the trocars under direct vision. The wound was thoroughly irrigated and the fascia was then closed with a figure of eight suture; the skin was then closed with 4 O monocryl  and a sterile dressing was applied.  Instrument, sponge, and needle counts were correct at closure and at the conclusion of the case.   Findings:  Cholelithiasis  Estimated Blood Loss: Minimal                 Total IV Fluids: 400 mL         Specimens: Gallbladder           Complications: None; patient tolerated the procedure well.         Disposition: PACU - hemodynamically stable.         Condition: stable

## 2013-04-16 NOTE — Anesthesia Procedure Notes (Signed)
Procedure Name: Intubation Date/Time: 04/16/2013 8:38 AM Performed by: Gwenyth Allegra Pre-anesthesia Checklist: Patient identified, Timeout performed, Emergency Drugs available, Suction available and Patient being monitored Patient Re-evaluated:Patient Re-evaluated prior to inductionOxygen Delivery Method: Circle system utilized Preoxygenation: Pre-oxygenation with 100% oxygen Intubation Type: IV induction Ventilation: Mask ventilation without difficulty Laryngoscope Size: Mac Grade View: Grade I Tube type: Oral Tube size: 7.5 mm Number of attempts: 1 Airway Equipment and Method: Stylet Placement Confirmation: ETT inserted through vocal cords under direct vision,  breath sounds checked- equal and bilateral and positive ETCO2 Secured at: 21 cm Tube secured with: Tape Dental Injury: Teeth and Oropharynx as per pre-operative assessment

## 2013-04-16 NOTE — Transfer of Care (Signed)
Immediate Anesthesia Transfer of Care Note  Patient: Walter Alvarado  Procedure(s) Performed: Procedure(s): LAPAROSCOPIC CHOLECYSTECTOMY WITH INTRAOPERATIVE CHOLANGIOGRAM (N/A)  Patient Location: PACU  Anesthesia Type:General  Level of Consciousness: awake and alert   Airway & Oxygen Therapy: Patient Spontanous Breathing and Patient connected to nasal cannula oxygen  Post-op Assessment: Report given to PACU RN and Post -op Vital signs reviewed and stable  Post vital signs: Reviewed and stable  Complications: No apparent anesthesia complications

## 2013-04-16 NOTE — Preoperative (Signed)
Beta Blockers   Reason not to administer Beta Blockers:Not Applicable 

## 2013-04-16 NOTE — Anesthesia Postprocedure Evaluation (Signed)
  Anesthesia Post-op Note  Patient: Walter Alvarado  Procedure(s) Performed: Procedure(s): LAPAROSCOPIC CHOLECYSTECTOMY WITH INTRAOPERATIVE CHOLANGIOGRAM (N/A)  Patient Location: PACU  Anesthesia Type:General  Level of Consciousness: awake  Airway and Oxygen Therapy: Patient Spontanous Breathing  Post-op Pain: mild  Post-op Assessment: Post-op Vital signs reviewed  Post-op Vital Signs: Reviewed  Complications: No apparent anesthesia complications 

## 2013-04-16 NOTE — H&P (View-Only) (Signed)
Patient ID: Walter Alvarado, male   DOB: 06/20/1970, 42 y.o.   MRN: [id number]  Chief Complaint  Patient presents with  . Routine Post Op    discuss gb sx    HPI Walter Alvarado is a 42 y.o. male.  Patient sent at request of Dr. David Kaminski for epigastric abdominal pain. He was seen at the [facility] emergency department on 01/14/13 for epigastric abdominal pain. This had lasted for hours. It was severe. Associated nausea and vomiting. Ultrasound showed gallstones no gallbladder wall thickening. Symptoms made worse with eating. No radiation. Pain is sharp in nature. This subsided he was sent home. Patient returns to reschedule surgery since he was diagnosed with elevated RBC secondary to COPD, chronic and alcohol abuse. He has been cleared by hematology to proceed with surgery. No new complaints otherwise. He has cut back his drinking and smoking he states. HPI  Past Medical History  Diagnosis Date  . Substance abuse   . Anxiety   . Shortness of breath     2-3 wks ago started sob at bedtime  . 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 Polycythemia     Past Surgical History  Procedure Laterality Date  . Leg surgery      broke leg  . Knee arthroscopy Left     took all the  cartlige out  . Knee arthroscopy Right   . Shoulder arthroscopy Right   . Leg surgery Left     placed metal rod (femur)after 6 months took it out  . External ear surgery Right 1989    Family History  Problem Relation Age of Onset  . Cancer Maternal Grandmother   . Aneurysm Mother   . Heart attack Sister 38  . Cirrhosis Father     Social History History  Substance Use Topics  . Smoking status: Current Every Day Smoker -- 2.00 packs/day for 31 years    Types: Cigarettes  . Smokeless tobacco: Never Used     Comment: 2 cigs a day  . Alcohol Use: 1.8 oz/week    3 Cans of beer per week     Comment: 3-4 beers per night    Allergies  Allergen Reactions  . Nsaids     REACTION:  INTOLERANT    Current Outpatient Prescriptions  Medication Sig Dispense Refill  . ALPRAZolam (XANAX) 1 MG tablet Take 1 mg by mouth 4 (four) times daily as needed for anxiety.       . tiotropium (SPIRIVA) 18 MCG inhalation capsule Place 1 capsule (18 mcg total) into inhaler and inhale daily.  30 capsule  6  . traZODone (DESYREL) 50 MG tablet Take 100 mg by mouth at bedtime.        No current facility-administered medications for this visit.    Review of Systems Review of Systems  Constitutional: Negative for fever, chills and unexpected weight change.  HENT: Negative for hearing loss, congestion, sore throat, trouble swallowing and voice change.   Eyes: Negative for visual disturbance.  Respiratory: Positive for cough. Negative for wheezing.   Cardiovascular: Negative for chest pain, palpitations and leg swelling.  Gastrointestinal: Positive for abdominal pain. Negative for nausea, vomiting, diarrhea, constipation, blood in stool, abdominal distention, anal bleeding and rectal pain.  Genitourinary: Negative for hematuria and difficulty urinating.  Musculoskeletal: Negative for arthralgias.  Skin: Negative for rash and wound.  Neurological: Negative for seizures, syncope, weakness and headaches.  Hematological: Negative for adenopathy. Does   not bruise/bleed easily.  Psychiatric/Behavioral: Negative for confusion.    Blood pressure 122/80, pulse 72, temperature 98.6 F (37 C), temperature source Temporal, resp. rate 14, height 5' 9.5" (1.765 m), weight 123 lb 12.8 oz (56.155 kg).  Physical Exam Physical Exam  Constitutional: He is oriented to person, place, and time. He appears well-developed and well-nourished.  HENT:  Head: Normocephalic and atraumatic.  Eyes: EOM are normal. Pupils are equal, round, and reactive to light. No scleral icterus.  Neck: Normal range of motion.  Cardiovascular: Normal rate and regular rhythm.   Pulmonary/Chest: Effort normal and breath sounds  normal.  Abdominal: Soft. Bowel sounds are normal. He exhibits no mass. There is no tenderness. There is no rebound and no guarding.  Musculoskeletal: Normal range of motion.  Neurological: He is alert and oriented to person, place, and time.  Skin: Skin is warm and dry.  Psychiatric: He has a normal mood and affect. His behavior is normal. Judgment and thought content normal.    Data Reviewed U/s and labs from [facility]  Gallstones and normal  LFT.  CBD 5 mm   Assessment    Symptomatic cholelithiasis COPD  Alcohol abuse  Elevated RBC count from above evaluated by hematology and now clear for surgery and    Plan    Recommend laparoscopic cholecystectomy cholangiogram.The procedure has been discussed with the patient. Operative and non operative treatments have been discussed. Risks of surgery include bleeding, infection,  Common bile duct injury,  Injury to the stomach,liver, colon,small intestine, abdominal wall,  Diaphragm,  Major blood vessels,  And the need for an open procedure.  Other risks include worsening of medical problems, death,  DVT and pulmonary embolism, and cardiovascular events.   Medical options have also been discussed. The patient has been informed of long term expectations of surgery and non surgical options,  The patient agrees to proceed.  Patient understands increased cardiovascular risk given his medical problems and elevated RBC count. Increased risk of myocardial infarction, stroke, DVT and other issues. He still wishes to proceed at this point and has begun lifestyle modifications to improve his underlying condition.       Walter Alvarado A. 03/28/2013, 11:10 AM    

## 2013-04-16 NOTE — Anesthesia Postprocedure Evaluation (Signed)
  Anesthesia Post-op Note  Patient: Walter Alvarado  Procedure(s) Performed: Procedure(s): LAPAROSCOPIC CHOLECYSTECTOMY WITH INTRAOPERATIVE CHOLANGIOGRAM (N/A)  Patient Location: PACU  Anesthesia Type:General  Level of Consciousness: awake  Airway and Oxygen Therapy: Patient Spontanous Breathing  Post-op Pain: mild  Post-op Assessment: Post-op Vital signs reviewed  Post-op Vital Signs: Reviewed  Complications: No apparent anesthesia complications

## 2013-04-21 ENCOUNTER — Encounter (HOSPITAL_COMMUNITY): Payer: Self-pay | Admitting: Surgery

## 2013-05-02 ENCOUNTER — Encounter (INDEPENDENT_AMBULATORY_CARE_PROVIDER_SITE_OTHER): Payer: Self-pay | Admitting: Surgery

## 2013-05-02 ENCOUNTER — Ambulatory Visit (INDEPENDENT_AMBULATORY_CARE_PROVIDER_SITE_OTHER): Payer: BC Managed Care – PPO | Admitting: Surgery

## 2013-05-02 VITALS — BP 108/68 | HR 72 | Temp 100.4°F | Resp 14 | Ht 69.5 in | Wt 122.6 lb

## 2013-05-02 DIAGNOSIS — Z9889 Other specified postprocedural states: Secondary | ICD-10-CM

## 2013-05-02 NOTE — Progress Notes (Signed)
He  is here for a postop visit following laparoscopic cholecystectomy.  Diet is being tolerated, bowels are moving.  No problems with incisions.  PE:  ABD:  Soft, incisions clean/dry/intact and solid.  Assessment:  Doing well postop.  Plan:  Lowfat diet recommended.  Activities as tolerated.  Return visit prn. RECOMMEND ETOH AND TOBACCO CESSATION PV   FOLLOWED BY HEMATOLOGY

## 2013-05-02 NOTE — Patient Instructions (Signed)
RESUME FULL ACTIVITY.  RETURN AS NEEDED.  

## 2013-06-07 ENCOUNTER — Other Ambulatory Visit: Payer: Self-pay | Admitting: Oncology

## 2013-06-10 ENCOUNTER — Telehealth: Payer: Self-pay | Admitting: Oncology

## 2013-06-10 NOTE — Telephone Encounter (Signed)
pt's appt for 2/23 remains. per 2/14 pof move appt for work in chemo pt. another time used for he other pt.

## 2013-06-12 ENCOUNTER — Other Ambulatory Visit: Payer: Self-pay | Admitting: Oncology

## 2013-06-12 DIAGNOSIS — D751 Secondary polycythemia: Secondary | ICD-10-CM

## 2013-06-16 ENCOUNTER — Other Ambulatory Visit: Payer: BC Managed Care – PPO

## 2013-06-16 ENCOUNTER — Ambulatory Visit: Payer: BC Managed Care – PPO | Admitting: Oncology

## 2013-08-22 ENCOUNTER — Ambulatory Visit (INDEPENDENT_AMBULATORY_CARE_PROVIDER_SITE_OTHER): Payer: BC Managed Care – PPO | Admitting: Internal Medicine

## 2013-08-22 ENCOUNTER — Encounter: Payer: Self-pay | Admitting: Internal Medicine

## 2013-08-22 ENCOUNTER — Telehealth: Payer: Self-pay

## 2013-08-22 VITALS — BP 118/74 | HR 102 | Temp 98.0°F | Ht 70.0 in | Wt 120.0 lb

## 2013-08-22 DIAGNOSIS — Z0000 Encounter for general adult medical examination without abnormal findings: Secondary | ICD-10-CM

## 2013-08-22 LAB — LIPID PANEL
CHOLESTEROL: 133 mg/dL (ref 0–200)
HDL: 57 mg/dL (ref >39.00)
LDL CALC: 60 mg/dL (ref 0–99)
Total CHOL/HDL Ratio: 2
Triglycerides: 81 mg/dL (ref 0.0–149.0)
VLDL: 16.2 mg/dL (ref 0.0–40.0)

## 2013-08-22 LAB — CBC
HEMATOCRIT: 56.7 % — AB (ref 39.0–52.0)
HEMOGLOBIN: 19.2 g/dL — AB (ref 13.0–17.0)
MCHC: 33.8 g/dL (ref 30.0–36.0)
MCV: 104.5 fl — ABNORMAL HIGH (ref 78.0–100.0)
Platelets: 142 10*3/uL — ABNORMAL LOW (ref 150.0–400.0)
RBC: 5.43 Mil/uL (ref 4.22–5.81)
RDW: 13.8 % (ref 11.5–14.6)
WBC: 8.5 10*3/uL (ref 4.5–10.5)

## 2013-08-22 LAB — COMPREHENSIVE METABOLIC PANEL
ALT: 52 U/L (ref 0–53)
AST: 41 U/L — ABNORMAL HIGH (ref 0–37)
Albumin: 4.2 g/dL (ref 3.5–5.2)
Alkaline Phosphatase: 71 U/L (ref 39–117)
BILIRUBIN TOTAL: 0.6 mg/dL (ref 0.3–1.2)
BUN: 9 mg/dL (ref 6–23)
CO2: 27 mEq/L (ref 19–32)
CREATININE: 0.7 mg/dL (ref 0.4–1.5)
Calcium: 9.4 mg/dL (ref 8.4–10.5)
Chloride: 101 mEq/L (ref 96–112)
GFR: 126.03 mL/min (ref >60.00)
GLUCOSE: 87 mg/dL (ref 70–99)
Potassium: 4.3 mEq/L (ref 3.5–5.1)
SODIUM: 138 meq/L (ref 135–145)
Total Protein: 7 g/dL (ref 6.0–8.3)

## 2013-08-22 NOTE — Patient Instructions (Addendum)

## 2013-08-22 NOTE — Telephone Encounter (Signed)
Critical on Hemoglobin 19.2--Walter Alvarado is aware

## 2013-08-22 NOTE — Progress Notes (Signed)
Pre visit review using our clinic review tool, if applicable. No additional management support is needed unless otherwise documented below in the visit note. 

## 2013-08-22 NOTE — Progress Notes (Signed)
HPI  Pt presents to the clinic today to establish care. He has not had a PCP in many years. He is a current smoker, recently diagnoses with COPD. Follows with [facility] pulmonology. Also history of anxiety, on trazadone and xanax, follows with psychiatry. He has no concerns today.  Flu: 01/2013 Tetanus: not within 10 years Eye Doctor: as needed Dentist: biannually Past Medical History  Diagnosis Date  . Substance abuse   . Anxiety   . Shortness of breath     2-3 wks ago started sob at bedtime  . 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 Polycythemia   . COPD (chronic obstructive pulmonary disease)     Current Outpatient Prescriptions  Medication Sig Dispense Refill  . ALPRAZolam (XANAX) 1 MG tablet Take 1 mg by mouth 4 (four) times daily as needed for anxiety.       Marland Kitchen tiotropium (SPIRIVA) 18 MCG inhalation capsule Place 1 capsule (18 mcg total) into inhaler and inhale daily.  30 capsule  6  . traZODone (DESYREL) 50 MG tablet Take 100 mg by mouth at bedtime.        No current facility-administered medications for this visit.    Allergies  Allergen Reactions  . Nsaids     REACTION: INTOLERANT    Family History  Problem Relation Age of Onset  . Cancer Maternal Grandmother   . Aneurysm Mother   . Heart attack Sister 45  . Cirrhosis Father     History   Social History  . Marital Status: Divorced    Spouse Name: N/A    Number of Children: N/A  . Years of Education: N/A   Occupational History  . Not on file.   Social History Main Topics  . Smoking status: Current Every Day Smoker -- 0.25 packs/day for 31 years    Types: Cigarettes  . Smokeless tobacco: Never Used     Comment: e-cigarettes  . Alcohol Use: 1.8 oz/week    3 Cans of beer per week     Comment: 3-4 beers per night  . Drug Use: 2.00 per week    Special: Marijuana  . Sexual Activity: Yes   Other Topics Concern  . Not on file   Social History Narrative  . No narrative on file     ROS:  Constitutional: Denies fever, malaise, fatigue, headache or abrupt weight changes.  HEENT: Denies eye pain, eye redness, ear pain, ringing in the ears, wax buildup, runny nose, nasal congestion, bloody nose, or sore throat. Respiratory: Pt reports cough. Denies difficulty breathing, shortness of breath, or sputum production.   Cardiovascular: Denies chest pain, chest tightness, palpitations or swelling in the hands or feet.  Gastrointestinal: Denies abdominal pain, bloating, constipation, diarrhea or blood in the stool.  GU: Denies frequency, urgency, pain with urination, blood in urine, odor or discharge. Musculoskeletal: Pt reports joint pain. Denies decrease in range of motion, difficulty with gait, muscle pain or joint swelling.  Skin: Denies redness, rashes, lesions or ulcercations.  Neurological: Denies dizziness, difficulty with memory, difficulty with speech or problems with balance and coordination.   No other specific complaints in a complete review of systems (except as listed in HPI above).  PE:  BP 118/74  Pulse 102  Temp(Src) 98 F (36.7 C) (Oral)  Ht 5\' 10"  (1.778 m)  Wt 120 lb (54.432 kg)  BMI 17.22 kg/m2  SpO2 98% Wt Readings from Last 3 Encounters:  08/22/13 120 lb (54.432 kg)  05/02/13  122 lb 9.6 oz (55.611 kg)  04/11/13 123 lb 3.2 oz (55.883 kg)    General: Appears hisstated age, underweight in NAD. HEENT: Head: normal shape and size; Eyes: sclera white, no icterus, conjunctiva pink, PERRLA and EOMs intact; Ears: Tm's gray and intact, normal light reflex; Nose: mucosa pink and moist, septum midline; Throat/Mouth: Teeth present, mucosa pink and moist, no lesions or ulcerations noted.  Neck: Normal range of motion. Neck supple, trachea midline. No massses, lumps or thyromegaly present.  Cardiovascular: Normal rate and rhythm. S1,S2 noted. ? S3. No murmur, rubs or gallops noted. No JVD or BLE edema. No carotid bruits noted. Pulmonary/Chest: Normal effort  and coarse vesicular breath sounds. No respiratory distress. No wheezes, rales or ronchi noted.  Abdomen: Soft and nontender. Normal bowel sounds, no bruits noted. No distention or masses noted. Liver, spleen and kidneys non palpable. Musculoskeletal: Normal range of motion. No signs of joint swelling. No difficulty with gait.  Neurological: Alert and oriented. Cranial nerves II-XII intact. Coordination normal. +DTRs bilaterally. Psychiatric: Mood and affect normal. Behavior is normal. Judgment and thought content normal.     BMET    Component Value Date/Time   NA 134* 04/11/2013 1017   K 4.4 04/11/2013 1017   CL 99 04/11/2013 1017   CO2 26 04/11/2013 1017   GLUCOSE 146* 04/11/2013 1017   BUN 10 04/11/2013 1017   CREATININE 0.73 04/11/2013 1017   CALCIUM 9.2 04/11/2013 1017   GFRNONAA >90 04/11/2013 1017   GFRAA >90 04/11/2013 1017    Lipid Panel  No results found for this basename: chol, trig, hdl, cholhdl, vldl, ldlcalc    CBC    Component Value Date/Time   WBC 5.5 04/11/2013 1017   WBC 7.6 03/17/2013 0757   RBC 6.09* 04/11/2013 1017   RBC 6.09* 03/17/2013 0757   HGB 21.7* 04/11/2013 1017   HGB 20.5* 03/17/2013 0757   HCT 61.0* 04/11/2013 1017   HCT 62.5* 03/17/2013 0757   PLT 110* 04/11/2013 1017   PLT 132* 03/17/2013 0757   MCV 100.2* 04/11/2013 1017   MCV 102.7* 03/17/2013 0757   MCH 35.6* 04/11/2013 1017   MCH 33.8* 03/17/2013 0757   MCHC 35.6 04/11/2013 1017   MCHC 32.9 03/17/2013 0757   RDW 13.3 04/11/2013 1017   RDW 14.1 03/17/2013 0757   LYMPHSABS 1.8 04/11/2013 1017   LYMPHSABS 1.7 03/17/2013 0757   MONOABS 0.5 04/11/2013 1017   MONOABS 0.6 03/17/2013 0757   EOSABS 0.0 04/11/2013 1017   EOSABS 0.1 03/17/2013 0757   BASOSABS 0.0 04/11/2013 1017   BASOSABS 0.0 03/17/2013 0757    Hgb A1C No results found for this basename: HGBA1C     Assessment and Plan:  Preventative Health Maintenance:  Encouraged him to cut back on the smoking and alcohol  - he declines at this time Will obtain basic screening labs today He declines tetanus booster today  RTC in 1 year or sooner if needed

## 2013-08-25 ENCOUNTER — Telehealth: Payer: Self-pay | Admitting: Internal Medicine

## 2013-08-25 NOTE — Telephone Encounter (Signed)
Relevant patient education mailed to patient.  

## 2013-08-26 ENCOUNTER — Other Ambulatory Visit: Payer: Self-pay | Admitting: Internal Medicine

## 2013-08-26 DIAGNOSIS — D751 Secondary polycythemia: Secondary | ICD-10-CM

## 2013-09-26 ENCOUNTER — Telehealth: Payer: Self-pay | Admitting: Oncology

## 2013-09-26 ENCOUNTER — Other Ambulatory Visit: Payer: Self-pay

## 2013-09-26 NOTE — Telephone Encounter (Signed)
, °

## 2013-09-29 ENCOUNTER — Other Ambulatory Visit: Payer: BC Managed Care – PPO

## 2013-09-29 ENCOUNTER — Ambulatory Visit: Payer: Self-pay | Admitting: Oncology

## 2013-10-26 ENCOUNTER — Other Ambulatory Visit: Payer: Self-pay | Admitting: Oncology

## 2013-10-27 ENCOUNTER — Ambulatory Visit: Payer: Self-pay | Admitting: Oncology

## 2013-10-27 ENCOUNTER — Other Ambulatory Visit: Payer: BC Managed Care – PPO

## 2013-10-28 ENCOUNTER — Telehealth: Payer: Self-pay

## 2013-10-28 NOTE — Telephone Encounter (Signed)
Walter Alvarado stated that he called and left a message on 10-24-13 to cancell appointment 10-27-13.  Rescheduled appointment to 12-22-13 at 1430 lab and 1500 for visit.  Hworks that day but stated that he can keep that appointment as Dr. Marko Plume does not have availability on Fridays thus far in August.

## 2013-12-20 ENCOUNTER — Other Ambulatory Visit: Payer: Self-pay | Admitting: Oncology

## 2013-12-22 ENCOUNTER — Other Ambulatory Visit: Payer: BC Managed Care – PPO

## 2013-12-22 ENCOUNTER — Ambulatory Visit: Payer: BC Managed Care – PPO | Admitting: Oncology

## 2014-03-26 ENCOUNTER — Encounter: Payer: Self-pay | Admitting: Family Medicine

## 2014-03-26 ENCOUNTER — Ambulatory Visit (INDEPENDENT_AMBULATORY_CARE_PROVIDER_SITE_OTHER): Payer: BC Managed Care – PPO | Admitting: Family Medicine

## 2014-03-26 VITALS — BP 118/74 | HR 88 | Temp 97.7°F | Wt 127.0 lb

## 2014-03-26 DIAGNOSIS — N528 Other male erectile dysfunction: Secondary | ICD-10-CM

## 2014-03-26 DIAGNOSIS — N529 Male erectile dysfunction, unspecified: Secondary | ICD-10-CM

## 2014-03-26 MED ORDER — SILDENAFIL CITRATE 100 MG PO TABS: 50.0000 mg | ORAL_TABLET | Freq: Every day | ORAL | Status: DC | PRN

## 2014-03-26 NOTE — Assessment & Plan Note (Addendum)
Discussed relation of severe COPD/hypoxia to ED, alcohol abuse contribution to ED, as well as possible unknown circulatory contribution. Recent FLP stable and patient denies any anginal symptoms. Advised need to quit smoking and stop alcohol to best treat presumed cause of ED. Otherwise PDE5 treatment may treat symptoms. If any new chest pain/dyspnea symptoms patient needs to notify us immediately for further cardiac evaluation.  The patient desires Viagra to treat his erectile dysfunction. He is informed that Viagra is sometimes not covered by insurance. It is available on a fee-for-service cost basis, and is relatively expensive. He can start with 50 mg dose, and increase to 100 mg if necessary. The method of use 1 hour prior to anticipated intercourse is explained. He should not use any more than one tablet in a 24 hour period. The effects of headache and priapism have been explained. 50% off coupon provided   The patient is not taking nitrates, and denies he has access to nitrates in any form at any time. I have counseled him not to take with nitrates of any form.

## 2014-03-26 NOTE — Progress Notes (Signed)
BP 118/74 mmHg  Pulse 88  Temp(Src) 97.7 F (36.5 C) (Oral)  Wt 127 lb (57.607 kg)   CC: discuss ED  Subjective:    Patient ID: Walter Alvarado, male    DOB: 10-25-1970, 43 y.o.   MRN: [id number]  HPI: Walter Alvarado is a 43 y.o. male presenting on 03/26/2014 for Erectile Dysfunction   Patient of Regina's presents today to discuss concerns with erectile dysfunction. Asked to see male provider for this reason.  Cholecystectomy 03/2013. Since then noticing more trouble with performance. Separated from GF earlier in the year, now back together. Denies stress in relationship. Noticing shortened erections and premature ejaculation. Some trouble obtaining but mainly trouble maintaining erection. Libido, sex drive still present. Denies urethral discharge, rashes, dysuria.  Denies new meds or new supplements.  Denies chest pain, tightness, dyspnea.  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 Longterm anxiety on trazodone/xanax followed by psych.  Polycythemia presumed 2/2 smoking.  H/o alcohol abuse. Current alcohol use - 3-4 beers several nights a week. MJ intermittently. fmhx sudden death - sister at age 45 yo ?aneurysm.  good chol levels 08/2013  Relevant past medical, surgical, family and social history reviewed and updated as indicated. Interim medical history since our last visit reviewed. Allergies and medications reviewed and updated.  Current Outpatient Prescriptions on File Prior to Visit  Medication Sig  . ALPRAZolam (XANAX) 1 MG tablet Take 1 mg by mouth 4 (four) times daily as needed for anxiety.   . traZODone (DESYREL) 50 MG tablet Take 100 mg by mouth at bedtime.   Marland Kitchen tiotropium (SPIRIVA) 18 MCG inhalation capsule Place 1 capsule (18 mcg total) into inhaler and inhale daily. (Patient not taking: Reported on 03/26/2014)   No current  facility-administered medications on file prior to visit.   Family History  Problem Relation Age of Onset  . Cancer Maternal Grandmother     unsure  . Aneurysm Mother   . Sudden death Sister 59    ?aneurysm  . CAD Maternal Grandfather     CABG  . Aneurysm Sister   . Cirrhosis Father      Review of Systems Per HPI unless specifically indicated above     Objective:    BP 118/74 mmHg  Pulse 88  Temp(Src) 97.7 F (36.5 C) (Oral)  Wt 127 lb (57.607 kg)  Wt Readings from Last 3 Encounters:  03/26/14 127 lb (57.607 kg)  08/22/13 120 lb (54.432 kg)  05/02/13 122 lb 9.6 oz (55.611 kg)    Physical Exam  Constitutional: He appears well-developed and well-nourished. No distress.  HENT:  Mouth/Throat: Oropharynx is clear and moist. No oropharyngeal exudate.  Neck: Normal range of motion. Neck supple. No thyromegaly present.  Cardiovascular: Normal rate, regular rhythm, normal heart sounds and intact distal pulses.   No murmur heard. Pulmonary/Chest: Effort normal and breath sounds normal. No respiratory distress. He has no wheezes. He has no rales.  Abdominal: Soft. Normal appearance and bowel sounds are normal. He exhibits no distension and no mass. There is no hepatosplenomegaly. There is no tenderness. There is no rigidity, no rebound, no guarding and negative Murphy's sign. Hernia confirmed negative in the right inguinal area and confirmed negative in the left inguinal area.  Genitourinary: Testes normal and penis normal. Right testis shows no mass, no swelling and no tenderness. Right testis is descended. Left  testis shows no mass, no swelling and no tenderness. Left testis is descended. Circumcised.  Musculoskeletal: He exhibits no edema.  Lymphadenopathy:    He has no cervical adenopathy.       Right: No inguinal adenopathy present.       Left: No inguinal adenopathy present.  Skin: Skin is warm and dry. No rash noted.  Psychiatric: He has a normal mood and affect.  Nursing  note and vitals reviewed.  Results for orders placed or performed in visit on 08/22/13  CBC  Result Value Ref Range   WBC 8.5 4.5 - 10.5 K/uL   RBC 5.43 4.22 - 5.81 Mil/uL   Platelets 142.0 (L) 150.0 - 400.0 K/uL   Hemoglobin 19.2 (HH) 13.0 - 17.0 g/dL   HCT 56.7 (H) 39.0 - 52.0 %   MCV 104.5 (H) 78.0 - 100.0 fl   MCHC 33.8 30.0 - 36.0 g/dL   RDW 13.8 11.5 - 14.6 %  Comprehensive metabolic panel  Result Value Ref Range   Sodium 138 135 - 145 mEq/L   Potassium 4.3 3.5 - 5.1 mEq/L   Chloride 101 96 - 112 mEq/L   CO2 27 19 - 32 mEq/L   Glucose, Bld 87 70 - 99 mg/dL   BUN 9 6 - 23 mg/dL   Creatinine, Ser 0.7 0.4 - 1.5 mg/dL   Total Bilirubin 0.6 0.3 - 1.2 mg/dL   Alkaline Phosphatase 71 39 - 117 U/L   AST 41 (H) 0 - 37 U/L   ALT 52 0 - 53 U/L   Total Protein 7.0 6.0 - 8.3 g/dL   Albumin 4.2 3.5 - 5.2 g/dL   Calcium 9.4 8.4 - 10.5 mg/dL   GFR 126.03 >60.00 mL/min  Lipid panel  Result Value Ref Range   Cholesterol 133 0 - 200 mg/dL   Triglycerides 81.0 0.0 - 149.0 mg/dL   HDL 57.00 >39.00 mg/dL   VLDL 16.2 0.0 - 40.0 mg/dL   LDL Cholesterol 60 0 - 99 mg/dL   Total CHOL/HDL Ratio 2       Assessment & Plan:   Problem List Items Addressed This Visit    Organic erectile dysfunction - Primary    Discussed relation of severe COPD/hypoxia to ED, alcohol abuse contribution to ED, as well as possible unknown circulatory contribution. Recent FLP stable and patient denies any anginal symptoms. Advised need to quit smoking and stop alcohol to best treat presumed cause of ED. Otherwise PDE5 treatment may treat symptoms. If any new chest pain/dyspnea symptoms patient needs to notify us immediately for further cardiac evaluation.  The patient desires Viagra to treat his erectile dysfunction. He is informed that Viagra is sometimes not covered by insurance. It is available on a fee-for-service cost basis, and is relatively expensive. He can start with 50 mg dose, and increase to 100 mg if  necessary. The method of use 1 hour prior to anticipated intercourse is explained. He should not use any more than one tablet in a 24 hour period. The effects of headache and priapism have been explained. 50% off coupon provided   The patient is not taking nitrates, and denies he has access to nitrates in any form at any time. I have counseled him not to take with nitrates of any form.         Follow up plan: Return if symptoms worsen or fail to improve.

## 2014-03-26 NOTE — Patient Instructions (Signed)
Best thing to do is back off smoking and alcohol. May try viagra 1/2 - 1 tablet at night as needed. Prescription provided today. We have to keep an eye on your lungs and liver and blood counts in the future.

## 2014-03-26 NOTE — Progress Notes (Signed)
Pre visit review using our clinic review tool, if applicable. No additional management support is needed unless otherwise documented below in the visit note. 

## 2014-06-18 ENCOUNTER — Ambulatory Visit (INDEPENDENT_AMBULATORY_CARE_PROVIDER_SITE_OTHER): Payer: BLUE CROSS/BLUE SHIELD | Admitting: Internal Medicine

## 2014-06-18 ENCOUNTER — Encounter: Payer: Self-pay | Admitting: Internal Medicine

## 2014-06-18 VITALS — BP 110/78 | HR 85 | Temp 97.9°F | Wt 130.0 lb

## 2014-06-18 DIAGNOSIS — M940 Chondrocostal junction syndrome [Tietze]: Secondary | ICD-10-CM

## 2014-06-18 DIAGNOSIS — J449 Chronic obstructive pulmonary disease, unspecified: Secondary | ICD-10-CM

## 2014-06-18 MED ORDER — TIOTROPIUM BROMIDE MONOHYDRATE 18 MCG IN CAPS: 18.0000 ug | ORAL_CAPSULE | Freq: Every day | RESPIRATORY_TRACT | Status: DC

## 2014-06-18 MED ORDER — PREDNISONE 10 MG PO TABS
ORAL_TABLET | ORAL | Status: DC
Start: 2014-06-18 — End: 2015-09-09

## 2014-06-18 NOTE — Patient Instructions (Signed)
Costochondritis Costochondritis, sometimes called Tietze syndrome, is a swelling and irritation (inflammation) of the tissue (cartilage) that connects your ribs with your breastbone (sternum). It causes pain in the chest and rib area. Costochondritis usually goes away on its own over time. It can take up to 6 weeks or longer to get better, especially if you are unable to limit your activities. CAUSES  Some cases of costochondritis have no known cause. Possible causes include:  Injury (trauma).  Exercise or activity such as lifting.  Severe coughing. SIGNS AND SYMPTOMS  Pain and tenderness in the chest and rib area.  Pain that gets worse when coughing or taking deep breaths.  Pain that gets worse with specific movements. DIAGNOSIS  Your health care provider will do a physical exam and ask about your symptoms. Chest X-rays or other tests may be done to rule out other problems. TREATMENT  Costochondritis usually goes away on its own over time. Your health care provider may prescribe medicine to help relieve pain. HOME CARE INSTRUCTIONS   Avoid exhausting physical activity. Try not to strain your ribs during normal activity. This would include any activities using chest, abdominal, and side muscles, especially if heavy weights are used.  Apply ice to the affected area for the first 2 days after the pain begins.  Put ice in a plastic bag.  Place a towel between your skin and the bag.  Leave the ice on for 20 minutes, 2-3 times a day.  Only take over-the-counter or prescription medicines as directed by your health care provider. SEEK MEDICAL CARE IF:  You have redness or swelling at the rib joints. These are signs of infection.  Your pain does not go away despite rest or medicine. SEEK IMMEDIATE MEDICAL CARE IF:   Your pain increases or you are very uncomfortable.  You have shortness of breath or difficulty breathing.  You cough up blood.  You have worse chest pains,  sweating, or vomiting.  You have a fever or persistent symptoms for more than 2-3 days.  You have a fever and your symptoms suddenly get worse. MAKE SURE YOU:   Understand these instructions.  Will watch your condition.  Will get help right away if you are not doing well or get worse. Document Released: 01/18/2005 Document Revised: 01/29/2013 Document Reviewed: 11/12/2012 [employer] Patient Information 2015 [employer]. This information is not intended to replace advice given to you by your health care provider. Make sure you discuss any questions you have with your health care provider.  

## 2014-06-18 NOTE — Progress Notes (Signed)
Pre visit review using our clinic review tool, if applicable. No additional management support is needed unless otherwise documented below in the visit note. 

## 2014-06-18 NOTE — Progress Notes (Signed)
Subjective:    Patient ID: Walter Alvarado, male    DOB: August 12, 1970, 44 y.o.   MRN: [id number]  HPI  Pt presents to the clinic today with multiple complaints.  1- He c/o cough and chest congestion. He reports this has been going on for the last few eyars. The cough is productive of clear mucous. He is short of breath but that is chronic for him. He denies fever, chills or body aches. He has a history of COPD and does continue to smoke. He does not take Spiriva daily.  2- He also c/o pain in his right upper chest. This started 10 days ago. He has pain with taking a deep breath, sneezing or coughing. He also has pain when he twist or moves the wrong way. He has been taking Tylenol OTC without much relief. He denies any injury to the area but reports he did have rough sexual intercourse just prior to noticing the pain. He reports his bowels and appetite are normal.  Review of Systems      Past Medical History  Diagnosis Date  . Substance abuse   . Anxiety   . Shortness of breath     2-3 wks ago started sob at bedtime  . 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 Polycythemia   . COPD (chronic obstructive pulmonary disease)     Current Outpatient Prescriptions  Medication Sig Dispense Refill  . ALPRAZolam (XANAX) 1 MG tablet Take 1 mg by mouth 4 (four) times daily as needed for anxiety.     . sildenafil (VIAGRA) 100 MG tablet Take 0.5-1 tablets (50-100 mg total) by mouth daily as needed for erectile dysfunction. 9 tablet 3  . tiotropium (SPIRIVA) 18 MCG inhalation capsule Place 1 capsule (18 mcg total) into inhaler and inhale daily. 30 capsule 6  . traZODone (DESYREL) 50 MG tablet Take 100 mg by mouth at bedtime.      No current facility-administered medications for this visit.    Allergies  Allergen Reactions  . Nsaids     REACTION: INTOLERANT    Family History  Problem Relation Age of Onset  . Cancer Maternal Grandmother     unsure  . Aneurysm Mother   . Sudden death  Sister 86    ?aneurysm  . CAD Maternal Grandfather     CABG  . Aneurysm Sister   . Cirrhosis Father     History   Social History  . Marital Status: Single    Spouse Name: N/A  . Number of Children: N/A  . Years of Education: N/A   Occupational History  . Not on file.   Social History Main Topics  . Smoking status: Current Every Day Smoker -- 1.50 packs/day for 31 years    Types: Cigarettes  . Smokeless tobacco: Never Used     Comment: 5-6/day  . Alcohol Use: 1.8 oz/week    3 Cans of beer per week     Comment: 3-4 beers per night  . Drug Use: 2.00 per week    Special: Marijuana  . Sexual Activity: Yes   Other Topics Concern  . Not on file   Social History Narrative     Constitutional: Denies fever, malaise, fatigue, headache or abrupt weight changes.  HEENT: Denies eye pain, eye redness, ear pain, ringing in the ears, wax buildup, runny nose, nasal congestion, bloody nose, or sore throat. Respiratory: Pt reports cough and shortness of breath. Denies difficulty breathing, or sputum  production.   Cardiovascular: Denies chest pain, chest tightness, palpitations or swelling in the hands or feet.  Gastrointestinal: Denies abdominal pain, bloating, constipation, diarrhea or blood in the stool.  Musculoskeletal: Pt reports chest wall pain. Denies decrease in range of motion, difficulty with gait, or joint pain and swelling.   No other specific complaints in a complete review of systems (except as listed in HPI above).  Objective:   Physical Exam   BP 110/78 mmHg  Pulse 85  Temp(Src) 97.9 F (36.6 C) (Oral)  Wt 130 lb (58.968 kg)  SpO2 96% Wt Readings from Last 3 Encounters:  06/18/14 130 lb (58.968 kg)  03/26/14 127 lb (57.607 kg)  08/22/13 120 lb (54.432 kg)    General: Appears his stated age, under nourished in NAD. Skin: Warm, dry and intact. No rashes, lesions or ulcerations noted. HEENT: Head: normal shape and size;  Neck: No adenopathy  noted. Cardiovascular: Normal rate and rhythm. S1,S2 noted.  No murmur, rubs or gallops noted. No JVD or BLE edema. No carotid bruits noted. Pulmonary/Chest: Normal effort and coarse vesicular breath sounds with intermittent expiratory wheezing. No respiratory distress. No rales or ronchi noted.  Abdomen: Soft and nontender. Normal bowel sounds, no bruits noted. No distention or masses noted. Liver, spleen and kidneys non palpable. Musculoskeletal: Normal flexion and extension of the spine. No pain with palpation of the thoracic and lumbar spine. Pain with palpation in between the ribs on the right side of the chest wall.   BMET    Component Value Date/Time   NA 138 08/22/2013 1409   K 4.3 08/22/2013 1409   CL 101 08/22/2013 1409   CO2 27 08/22/2013 1409   GLUCOSE 87 08/22/2013 1409   BUN 9 08/22/2013 1409   CREATININE 0.7 08/22/2013 1409   CALCIUM 9.4 08/22/2013 1409   GFRNONAA >90 04/11/2013 1017   GFRAA >90 04/11/2013 1017    Lipid Panel     Component Value Date/Time   CHOL 133 08/22/2013 1409   TRIG 81.0 08/22/2013 1409   HDL 57.00 08/22/2013 1409   CHOLHDL 2 08/22/2013 1409   VLDL 16.2 08/22/2013 1409   LDLCALC 60 08/22/2013 1409    CBC    Component Value Date/Time   WBC 8.5 08/22/2013 1409   WBC 7.6 03/17/2013 0757   RBC 5.43 08/22/2013 1409   RBC 6.09* 03/17/2013 0757   HGB 19.2* 08/22/2013 1409   HGB 20.5* 03/17/2013 0757   HCT 56.7* 08/22/2013 1409   HCT 62.5* 03/17/2013 0757   PLT 142.0* 08/22/2013 1409   PLT 132* 03/17/2013 0757   MCV 104.5* 08/22/2013 1409   MCV 102.7* 03/17/2013 0757   MCH 35.6* 04/11/2013 1017   MCH 33.8* 03/17/2013 0757   MCHC 33.8 08/22/2013 1409   MCHC 32.9 03/17/2013 0757   RDW 13.8 08/22/2013 1409   RDW 14.1 03/17/2013 0757   LYMPHSABS 1.8 04/11/2013 1017   LYMPHSABS 1.7 03/17/2013 0757   MONOABS 0.5 04/11/2013 1017   MONOABS 0.6 03/17/2013 0757   EOSABS 0.0 04/11/2013 1017   EOSABS 0.1 03/17/2013 0757   BASOSABS 0.0  04/11/2013 1017   BASOSABS 0.0 03/17/2013 0757    Hgb A1C No results found for: HGBA1C      Assessment & Plan:   Costochondritis:  eRx for Prednisone Taper x 6 days Splint with a pillow anytime you cough  COPD, chronic:  He has not been taking Spiriva and continues to smoke Discussed the importance of smoking cessation Spiriva refilled today  RTC as needed or if symptoms persist or worsen

## 2014-06-22 ENCOUNTER — Ambulatory Visit (INDEPENDENT_AMBULATORY_CARE_PROVIDER_SITE_OTHER): Payer: BLUE CROSS/BLUE SHIELD | Admitting: Internal Medicine

## 2014-06-22 ENCOUNTER — Ambulatory Visit (INDEPENDENT_AMBULATORY_CARE_PROVIDER_SITE_OTHER)
Admission: RE | Admit: 2014-06-22 | Discharge: 2014-06-22 | Disposition: A | Discharge status: Home / Self Care | Payer: BLUE CROSS/BLUE SHIELD | Source: Ambulatory Visit | Attending: Internal Medicine | Admitting: Internal Medicine

## 2014-06-22 ENCOUNTER — Encounter: Payer: Self-pay | Admitting: Internal Medicine

## 2014-06-22 VITALS — BP 116/78 | HR 92 | Temp 97.9°F | Wt 133.0 lb

## 2014-06-22 DIAGNOSIS — R059 Cough, unspecified: Secondary | ICD-10-CM

## 2014-06-22 DIAGNOSIS — R05 Cough: Secondary | ICD-10-CM

## 2014-06-22 DIAGNOSIS — R0602 Shortness of breath: Secondary | ICD-10-CM

## 2014-06-22 DIAGNOSIS — R0789 Other chest pain: Secondary | ICD-10-CM

## 2014-06-22 NOTE — Progress Notes (Signed)
HPI  Pt presents to the clinic today with c/o continued cough, shortness of breath and right sided chest pain. He was seen for the same 06/18/14. His cough is not productive. He denies fever, chills or body aches. He has taken the pred taper as prescribed but reports it has not helped. He has also been using his inhaler without releif. He has not had sick contacts. He does smoke.  Review of Systems      Past Medical History  Diagnosis Date  . Substance abuse   . Anxiety   . Shortness of breath     2-3 wks ago started sob at bedtime  . 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 Polycythemia   . COPD (chronic obstructive pulmonary disease)     Family History  Problem Relation Age of Onset  . Cancer Maternal Grandmother     unsure  . Aneurysm Mother   . Sudden death Sister 64    ?aneurysm  . CAD Maternal Grandfather     CABG  . Aneurysm Sister   . Cirrhosis Father     History   Social History  . Marital Status: Single    Spouse Name: N/A  . Number of Children: N/A  . Years of Education: N/A   Occupational History  . Not on file.   Social History Main Topics  . Smoking status: Current Every Day Smoker -- 1.50 packs/day for 31 years    Types: Cigarettes  . Smokeless tobacco: Never Used     Comment: 5-6/day  . Alcohol Use: 1.8 oz/week    3 Cans of beer per week     Comment: 3-4 beers per night  . Drug Use: 2.00 per week    Special: Marijuana  . Sexual Activity: Yes   Other Topics Concern  . Not on file   Social History Narrative    Allergies  Allergen Reactions  . Nsaids     REACTION: INTOLERANT     Constitutional: Positive headache, fatigue and fever. Denies headache, fatigue, fever or abrupt weight changes.  HEENT:  Denies eye redness, eye pain, pressure behind the eyes, facial pain, nasal congestion, ear pain, ringing in the ears, wax buildup, runny nose or sore throat. Respiratory: Positive cough and shortness of breath. Denies difficulty breathing.   Cardiovascular: Denies chest pain, chest tightness, palpitations or swelling in the hands or feet.   No other specific complaints in a complete review of systems (except as listed in HPI above).  Objective:   BP 116/78 mmHg  Pulse 92  Temp(Src) 97.9 F (36.6 C) (Oral)  Wt 133 lb (60.328 kg)  SpO2 96% Wt Readings from Last 3 Encounters:  06/22/14 133 lb (60.328 kg)  06/18/14 130 lb (58.968 kg)  03/26/14 127 lb (57.607 kg)     General: Appears his stated age, well developed, well nourished in NAD. HEENT: Head: normal shape and size; Eyes: sclera white, no icterus, conjunctiva pink; Ears: Tm's gray and intact, normal light reflex; Nose: mucosa pink and moist, septum midline; Throat/Mouth: Teeth present, mucosa erythematous and moist, no exudate noted, no lesions or ulcerations noted.  Neck: No cervical lymphadenopathy.  Cardiovascular: Normal rate and rhythm. S1,S2 noted.  No murmur, rubs or gallops noted.  Pulmonary/Chest: Normal effort and diminished breath sounds noted on the right. No respiratory distress. No wheezes, rales or ronchi noted.      Assessment & Plan:   Cough, shortness of breath, and right side chest wall pain:  Go ahead and take your last dose of prednisone Will obtain chest xray today to r/o pneumonia or other lung abnormality Will check CBC today as well Advised him to stop smoking  RTC as needed or if symptoms persist.

## 2014-06-22 NOTE — Patient Instructions (Signed)
Cough, Adult  A cough is a reflex that helps clear your throat and airways. It can help heal the body or may be a reaction to an irritated airway. A cough may only last 2 or 3 weeks (acute) or may last more than 8 weeks (chronic).  CAUSES Acute cough:  Viral or bacterial infections. Chronic cough:  Infections.  Allergies.  Asthma.  Post-nasal drip.  Smoking.  Heartburn or acid reflux.  Some medicines.  Chronic lung problems (COPD).  Cancer. SYMPTOMS   Cough.  Fever.  Chest pain.  Increased breathing rate.  High-pitched whistling sound when breathing (wheezing).  Colored mucus that you cough up (sputum). TREATMENT   A bacterial cough may be treated with antibiotic medicine.  A viral cough must run its course and will not respond to antibiotics.  Your caregiver may recommend other treatments if you have a chronic cough. HOME CARE INSTRUCTIONS   Only take over-the-counter or prescription medicines for pain, discomfort, or fever as directed by your caregiver. Use cough suppressants only as directed by your caregiver.  Use a cold steam vaporizer or humidifier in your bedroom or home to help loosen secretions.  Sleep in a semi-upright position if your cough is worse at night.  Rest as needed.  Stop smoking if you smoke. SEEK IMMEDIATE MEDICAL CARE IF:   You have pus in your sputum.  Your cough starts to worsen.  You cannot control your cough with suppressants and are losing sleep.  You begin coughing up blood.  You have difficulty breathing.  You develop pain which is getting worse or is uncontrolled with medicine.  You have a fever. MAKE SURE YOU:   Understand these instructions.  Will watch your condition.  Will get help right away if you are not doing well or get worse. Document Released: 10/07/2010 Document Revised: 07/03/2011 Document Reviewed: 10/07/2010 [employer] Patient Information 2015 [employer]. This information is not intended  to replace advice given to you by your health care provider. Make sure you discuss any questions you have with your health care provider.  

## 2014-06-22 NOTE — Progress Notes (Signed)
Subjective:    Patient ID: Walter Alvarado, male    DOB: [birth year], 44 y.o.   MRN: [id number]  HPI Walter Alvarado is a 44 year old male who presents today for follow up. He was seen in office on 2/25 for cough and congestion as well as pain with coughing.  He was given prednisone and has not had any relief in his symptoms.  He is still coughing and having pain in his right chest.     Review of Systems  Constitutional: Negative for fever, fatigue and unexpected weight change.  HENT: Positive for congestion. Negative for ear pain, rhinorrhea, sinus pressure and sore throat.   Respiratory: Positive for cough and shortness of breath (chronic).   Cardiovascular: Negative for chest pain, palpitations and leg swelling.  Genitourinary: Negative.   Skin: Negative for color change, pallor, rash and wound.  Neurological: Negative for dizziness, light-headedness and headaches.   Past Medical History  Diagnosis Date  . Substance abuse   . Anxiety   . Shortness of breath     2-3 wks ago started sob at bedtime  . 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 Polycythemia   . COPD (chronic obstructive pulmonary disease)    Family History  Problem Relation Age of Onset  . Cancer Maternal Grandmother     unsure  . Aneurysm Mother   . Sudden death Sister 53    ?aneurysm  . CAD Maternal Grandfather     CABG  . Aneurysm Sister   . Cirrhosis Father    Current Outpatient Prescriptions on File Prior to Visit  Medication Sig Dispense Refill  . ALPRAZolam (XANAX) 1 MG tablet Take 1 mg by mouth 4 (four) times daily as needed for anxiety.     . predniSONE (DELTASONE) 10 MG tablet Take 6 tabs day 1, 5 tabs day 2, 4 tabs day 3, 3 tabs day 4, 2 tabs day 5, 1 tab day 6 21 tablet 0  . sildenafil (VIAGRA) 100 MG tablet Take 0.5-1 tablets (50-100 mg total) by mouth daily as needed for erectile dysfunction. 9 tablet 3  . tiotropium (SPIRIVA) 18 MCG inhalation capsule Place 1 capsule (18 mcg total) into inhaler  and inhale daily. 30 capsule 6  . traZODone (DESYREL) 50 MG tablet Take 100 mg by mouth at bedtime.      No current facility-administered medications on file prior to visit.        Objective:   Physical Exam  Constitutional: He is oriented to person, place, and time. He appears well-developed and well-nourished.  HENT:  Head: Normocephalic and atraumatic.  Right Ear: External ear normal.  Left Ear: External ear normal.  Mouth/Throat: Oropharynx is clear and moist.  Neck: Normal range of motion. Neck supple.  Cardiovascular: Normal rate, regular rhythm and normal heart sounds.   Pulmonary/Chest: Effort normal. He has decreased breath sounds in the right middle field and the right lower field.  Musculoskeletal: Normal range of motion.  Lymphadenopathy:    He has no cervical adenopathy.  Neurological: He is alert and oriented to person, place, and time.  Skin: Skin is warm and dry.  Psychiatric: He has a normal mood and affect.    BP 116/78 mmHg  Pulse 92  Temp(Src) 97.9 F (36.6 C) (Oral)  Wt 133 lb (60.328 kg)  SpO2 96%       Assessment & Plan:  1. Cough, shortness of breath  Will obtain chest x ray today and  follow up with patient.

## 2014-06-22 NOTE — Progress Notes (Signed)
Pre visit review using our clinic review tool, if applicable. No additional management support is needed unless otherwise documented below in the visit note. 

## 2014-06-23 ENCOUNTER — Telehealth: Payer: Self-pay

## 2014-06-23 ENCOUNTER — Other Ambulatory Visit: Payer: Self-pay | Admitting: Internal Medicine

## 2014-06-23 DIAGNOSIS — R0602 Shortness of breath: Secondary | ICD-10-CM

## 2014-06-23 DIAGNOSIS — R059 Cough, unspecified: Secondary | ICD-10-CM

## 2014-06-23 DIAGNOSIS — R05 Cough: Secondary | ICD-10-CM

## 2014-06-23 LAB — COMPREHENSIVE METABOLIC PANEL
ALBUMIN: 4.5 g/dL (ref 3.5–5.2)
ALK PHOS: 82 U/L (ref 39–117)
ALT: 19 U/L (ref 0–53)
AST: 18 U/L (ref 0–37)
BUN: 15 mg/dL (ref 6–23)
CALCIUM: 10 mg/dL (ref 8.4–10.5)
CO2: 30 mEq/L (ref 19–32)
Chloride: 100 mEq/L (ref 96–112)
Creatinine, Ser: 1.06 mg/dL (ref 0.40–1.50)
GFR: 80.35 mL/min (ref >60.00)
Glucose, Bld: 125 mg/dL — ABNORMAL HIGH (ref 70–99)
POTASSIUM: 4.7 meq/L (ref 3.5–5.1)
SODIUM: 137 meq/L (ref 135–145)
TOTAL PROTEIN: 7.1 g/dL (ref 6.0–8.3)
Total Bilirubin: 0.6 mg/dL (ref 0.2–1.2)

## 2014-06-23 LAB — CBC
HCT: 51.4 % (ref 39.0–52.0)
Hemoglobin: 17.6 g/dL — ABNORMAL HIGH (ref 13.0–17.0)
MCHC: 34.3 g/dL (ref 30.0–36.0)
MCV: 100.6 fl — AB (ref 78.0–100.0)
Platelets: 162 10*3/uL (ref 150.0–400.0)
RBC: 5.11 Mil/uL (ref 4.22–5.81)
RDW: 14.6 % (ref 11.5–15.5)
WBC: 9.7 10*3/uL (ref 4.0–10.5)

## 2014-06-23 NOTE — Telephone Encounter (Signed)
Mel You should have a result note about this. I don't know what to do other than refer to a lung specialist. If he feels bloated, ask if he is having regular BM's. If so, he can try Gas X OTC.

## 2014-06-23 NOTE — Telephone Encounter (Signed)
Pt is aware as instructed 

## 2014-06-23 NOTE — Telephone Encounter (Signed)
Kim pts fiancee left v/m requesting cb to her or pt with lab and xray results; also pt weighed 130 lbs on 06/18/14 and on 06/22/14 pt weighed 135. pts stomach is very bloated, pt feels like pressure pushing under ribs. Please advise.

## 2015-06-01 IMAGING — US ABDOMEN ULTRASOUND LIMITED
1 series · 14 of 25 positions shown · non-contrast
Comparison: none

REASON FOR EXAM: ruq pain nausea
COMMENTS:   Body Site: GB and Fossa, CBD, Head of Pancreas

PROCEDURE:     US  - US ABDOMEN LIMITED SURVEY  - January 14, 2013 [DATE]
RESULT:     History: Pain.
Comparison Study: No prior.

[Series 1: abdomen ultrasound limited · 0.23mm/px · 14 of 48 slices shown]
[im 1/48]
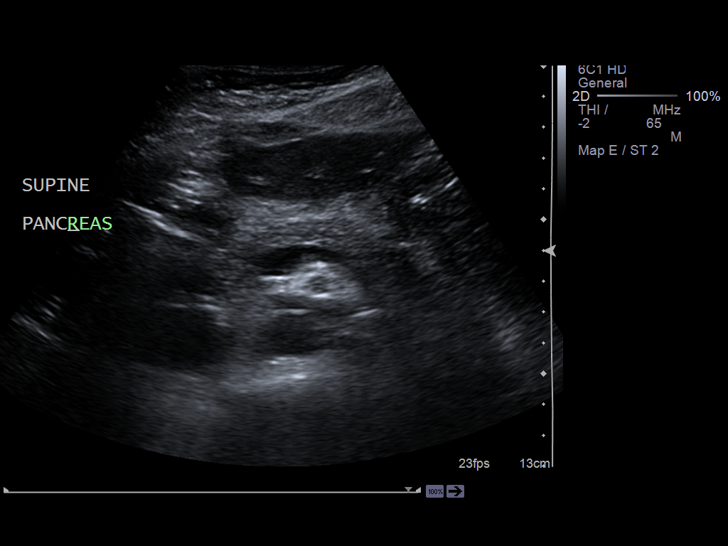
[im 4/48]
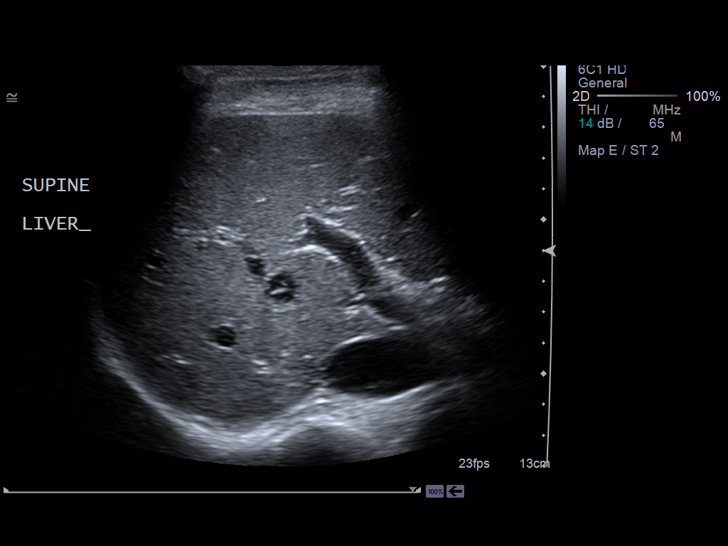
[im 8/48]
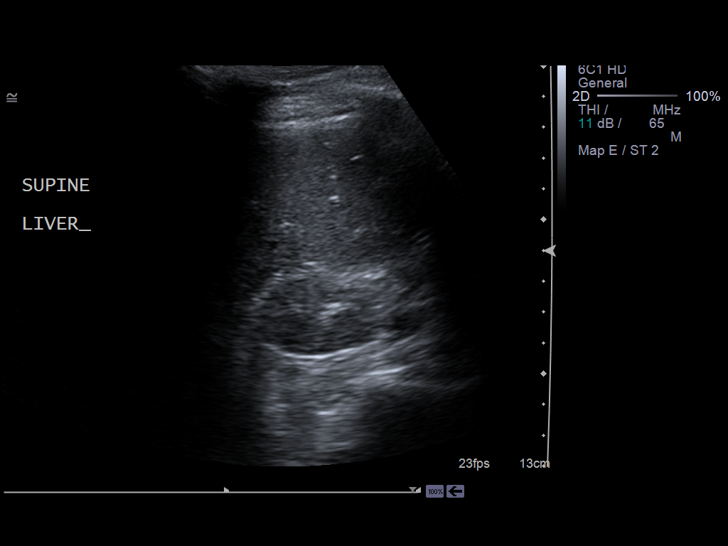
[im 12/48]
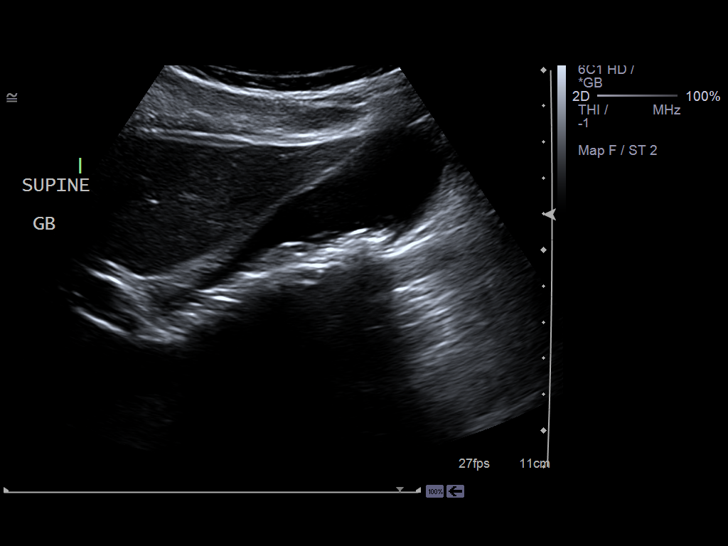
[im 16/48]
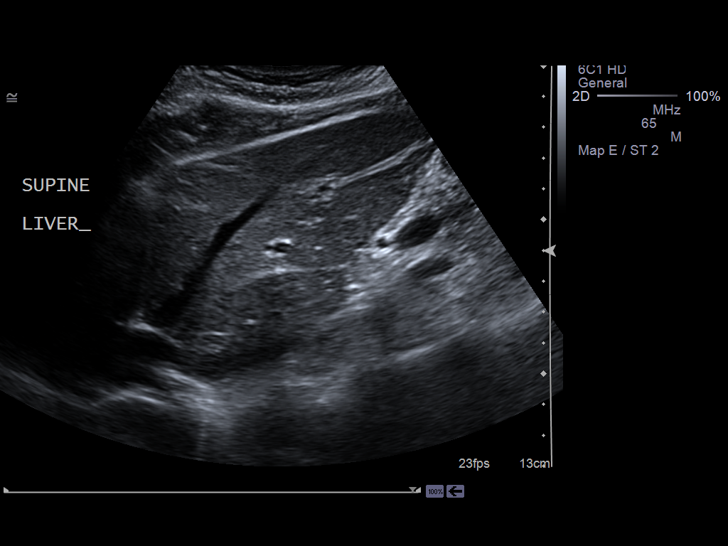
[im 18/48]
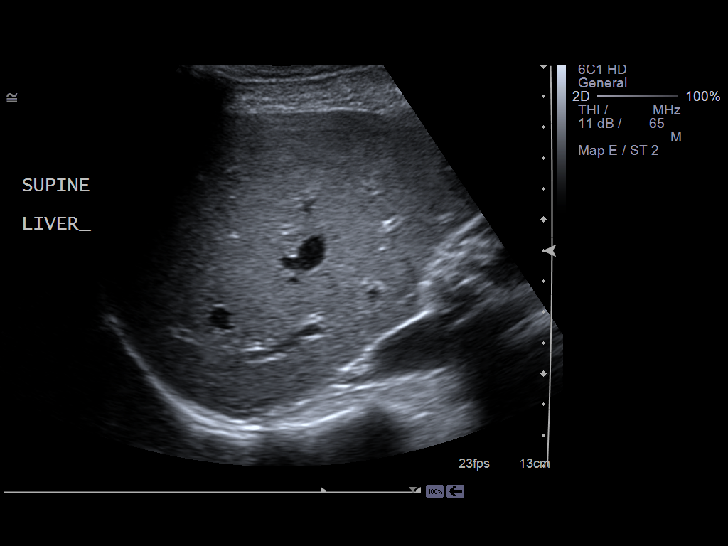
[im 22/48]
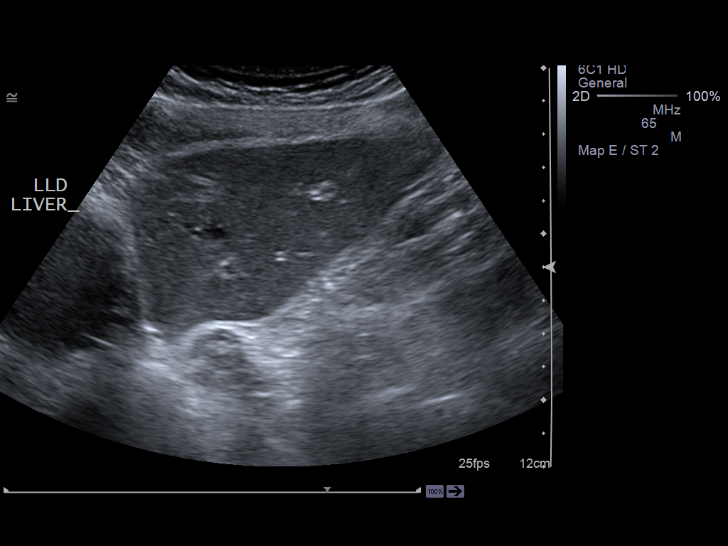
[im 26/48]
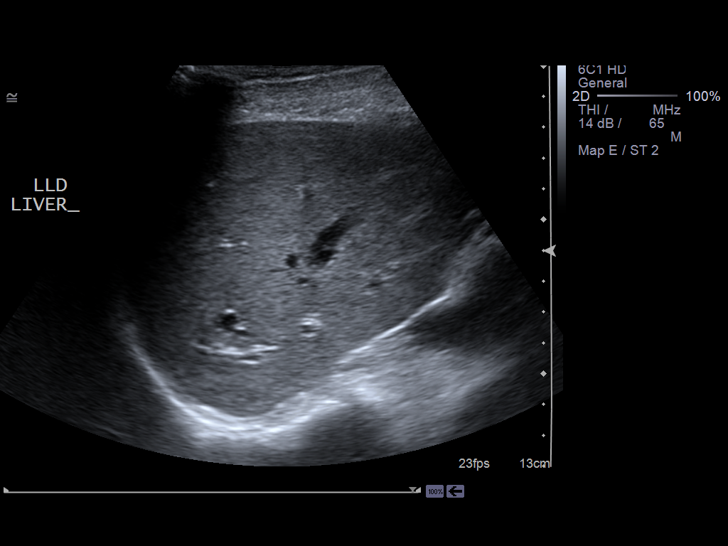
[im 30/48]
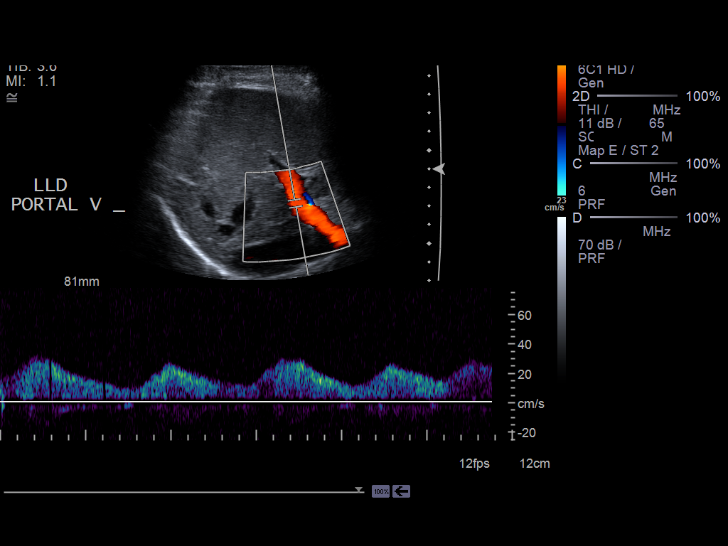
[im 32/48]
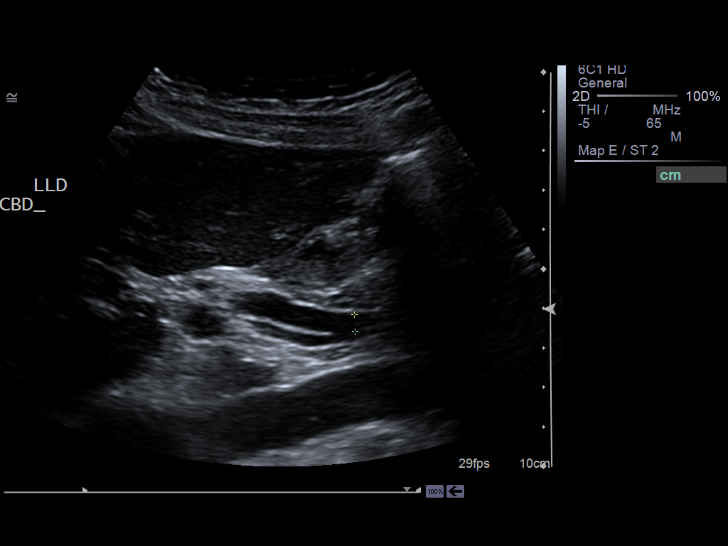
[im 36/48]
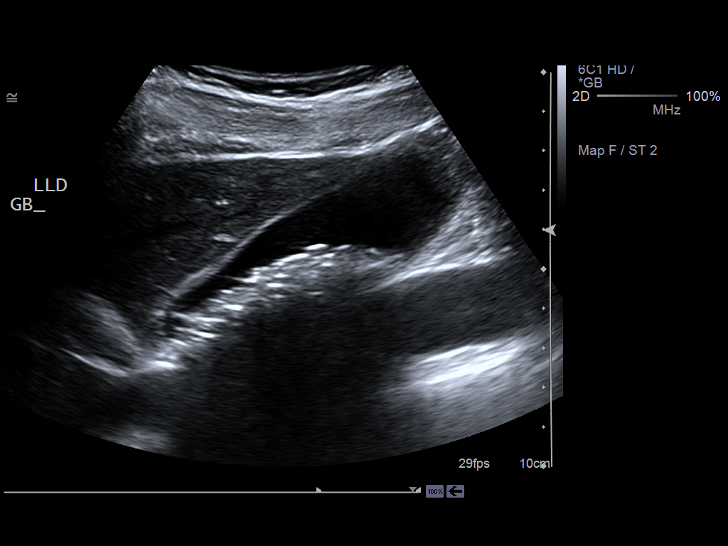
[im 40/48]
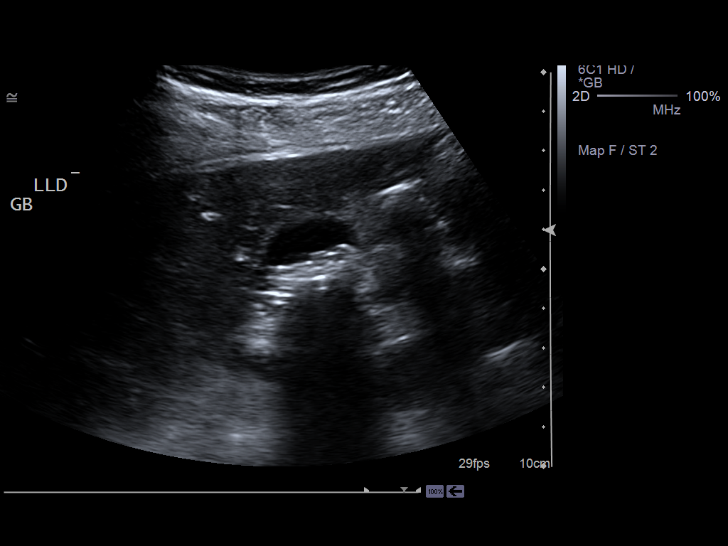
[im 44/48]
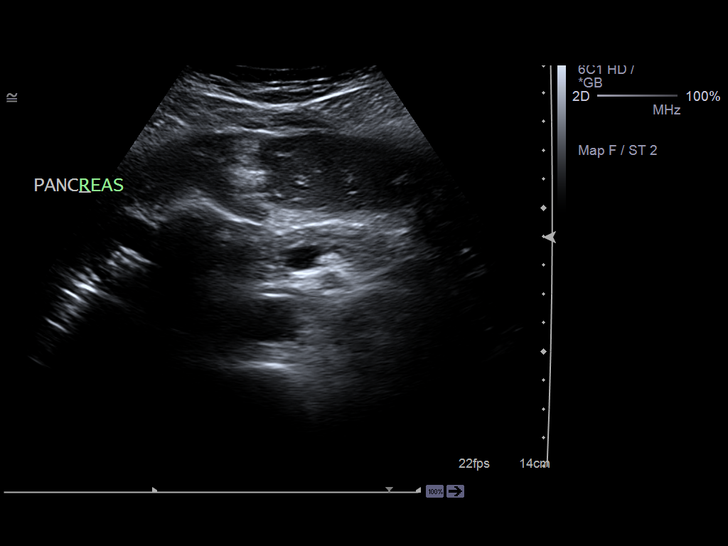
[im 48/48]
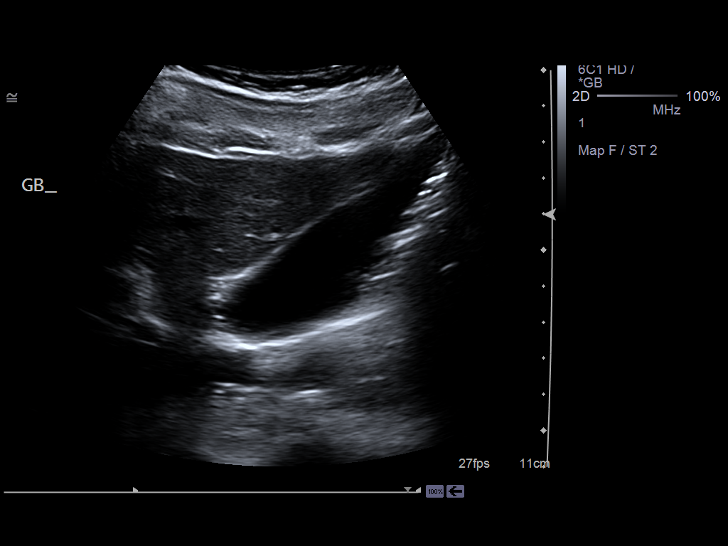

[14 of 25 positions shown; findings below may reference images not displayed]

FINDINGS: Liver normal. Portal vein patent. Pancreas normal. Gallstones are
present. Murphy sign negative. Gallbladder wall thickness 2.5 mm. Common
bile duct caliber 5.1 mm.
IMPRESSION: Gallstones. Exam otherwise unremarkable.

## 2015-06-01 IMAGING — CR DG CHEST 2V
1 series · 2 of 2 positions shown · non-contrast
Comparison: none

REASON FOR EXAM: pain to RUQ with deep inspiration
COMMENTS:

PROCEDURE:     DXR - DXR CHEST PA (OR AP) AND LATERAL  - January 14, 2013 [DATE]
RESULT:     The lungs are clear. The heart and pulmonary vessels are normal.
The bony and mediastinal structures are unremarkable. There is no effusion.
There is no pneumothorax or evidence of congestive failure.

[Series 1: w chest pa · 0.14mm/px · 2 of 2 slices shown]
[im 1/2]
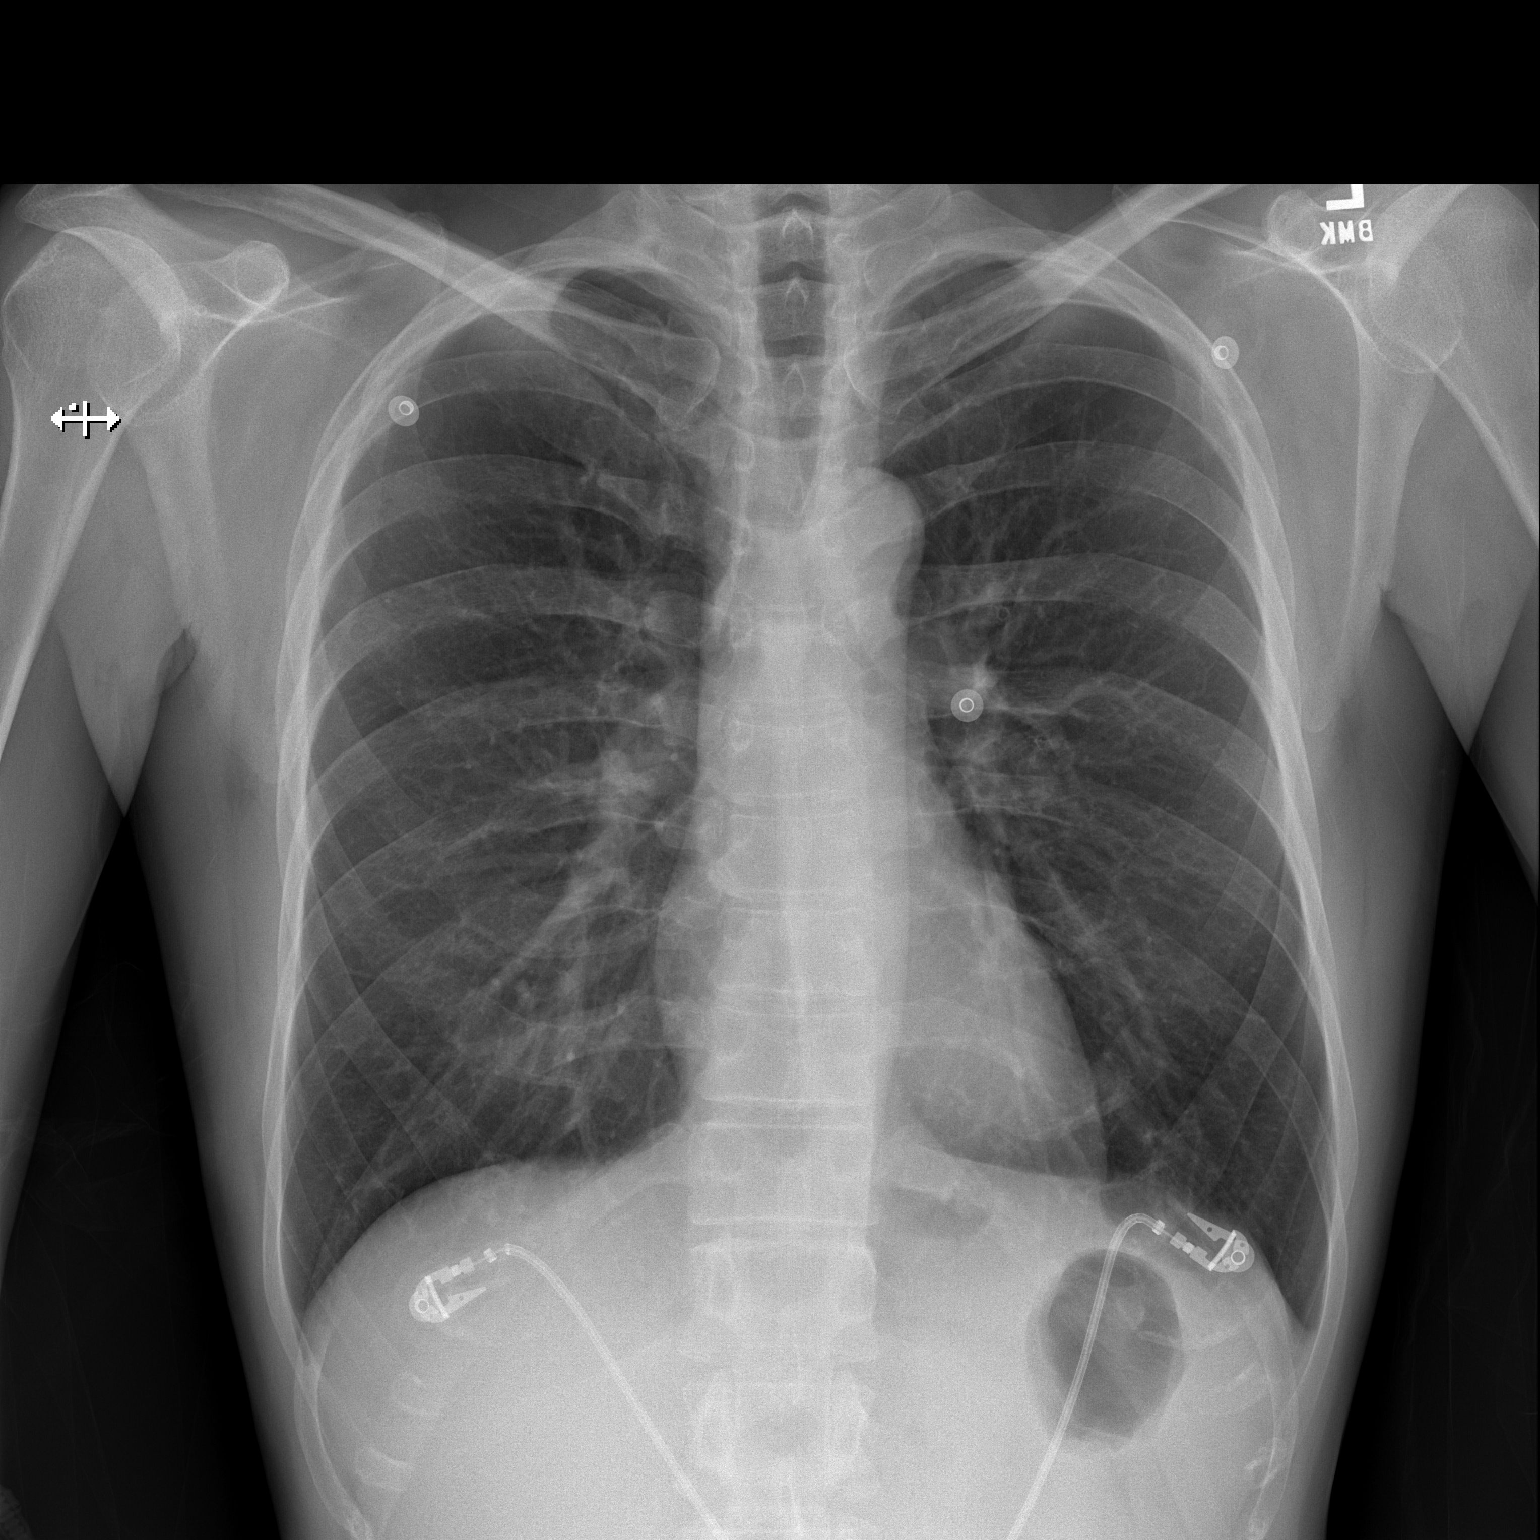
[im 2/2]
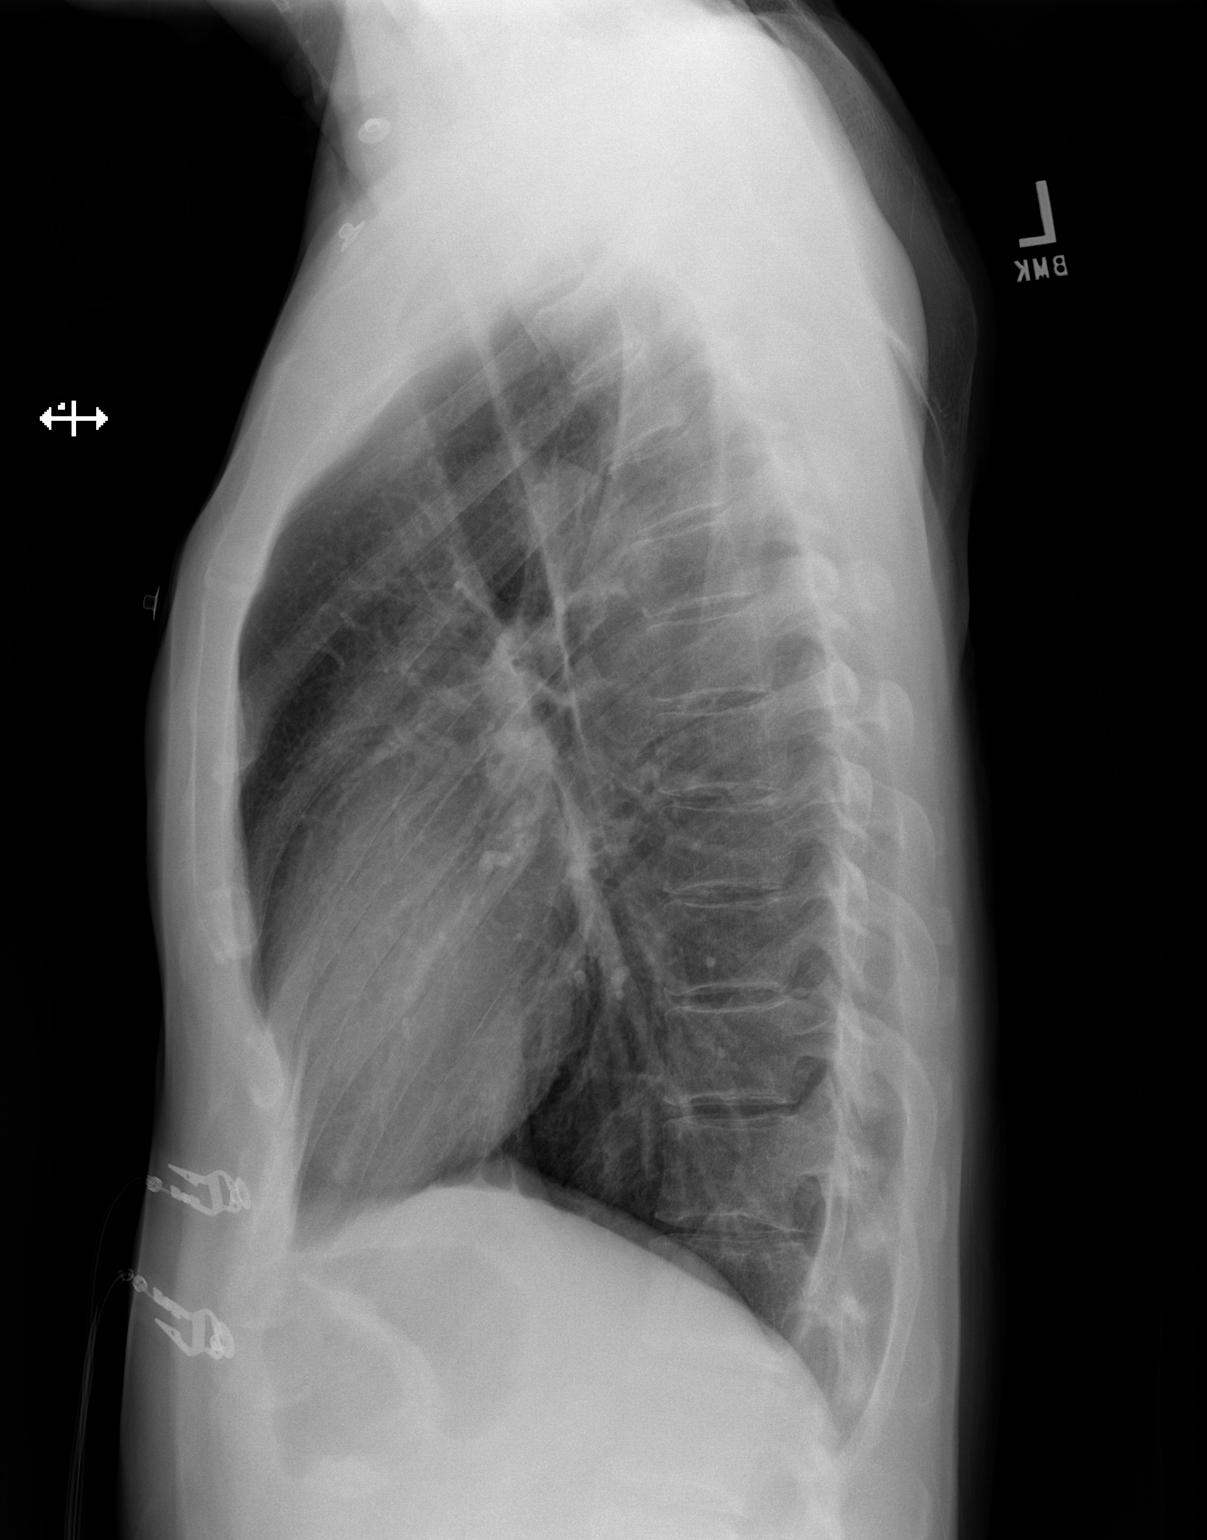

[2 of 2 positions shown; findings below may reference images not displayed]

IMPRESSION: No acute cardiopulmonary disease.

[REDACTED]

## 2015-06-21 IMAGING — CR DG CHEST 2V
2 series · 2 of 2 positions shown · non-contrast
Comparison: None.

CLINICAL DATA: Pre-admission. Gallbladder surgery.

EXAM:
CHEST  2 VIEW

[w chest pa]
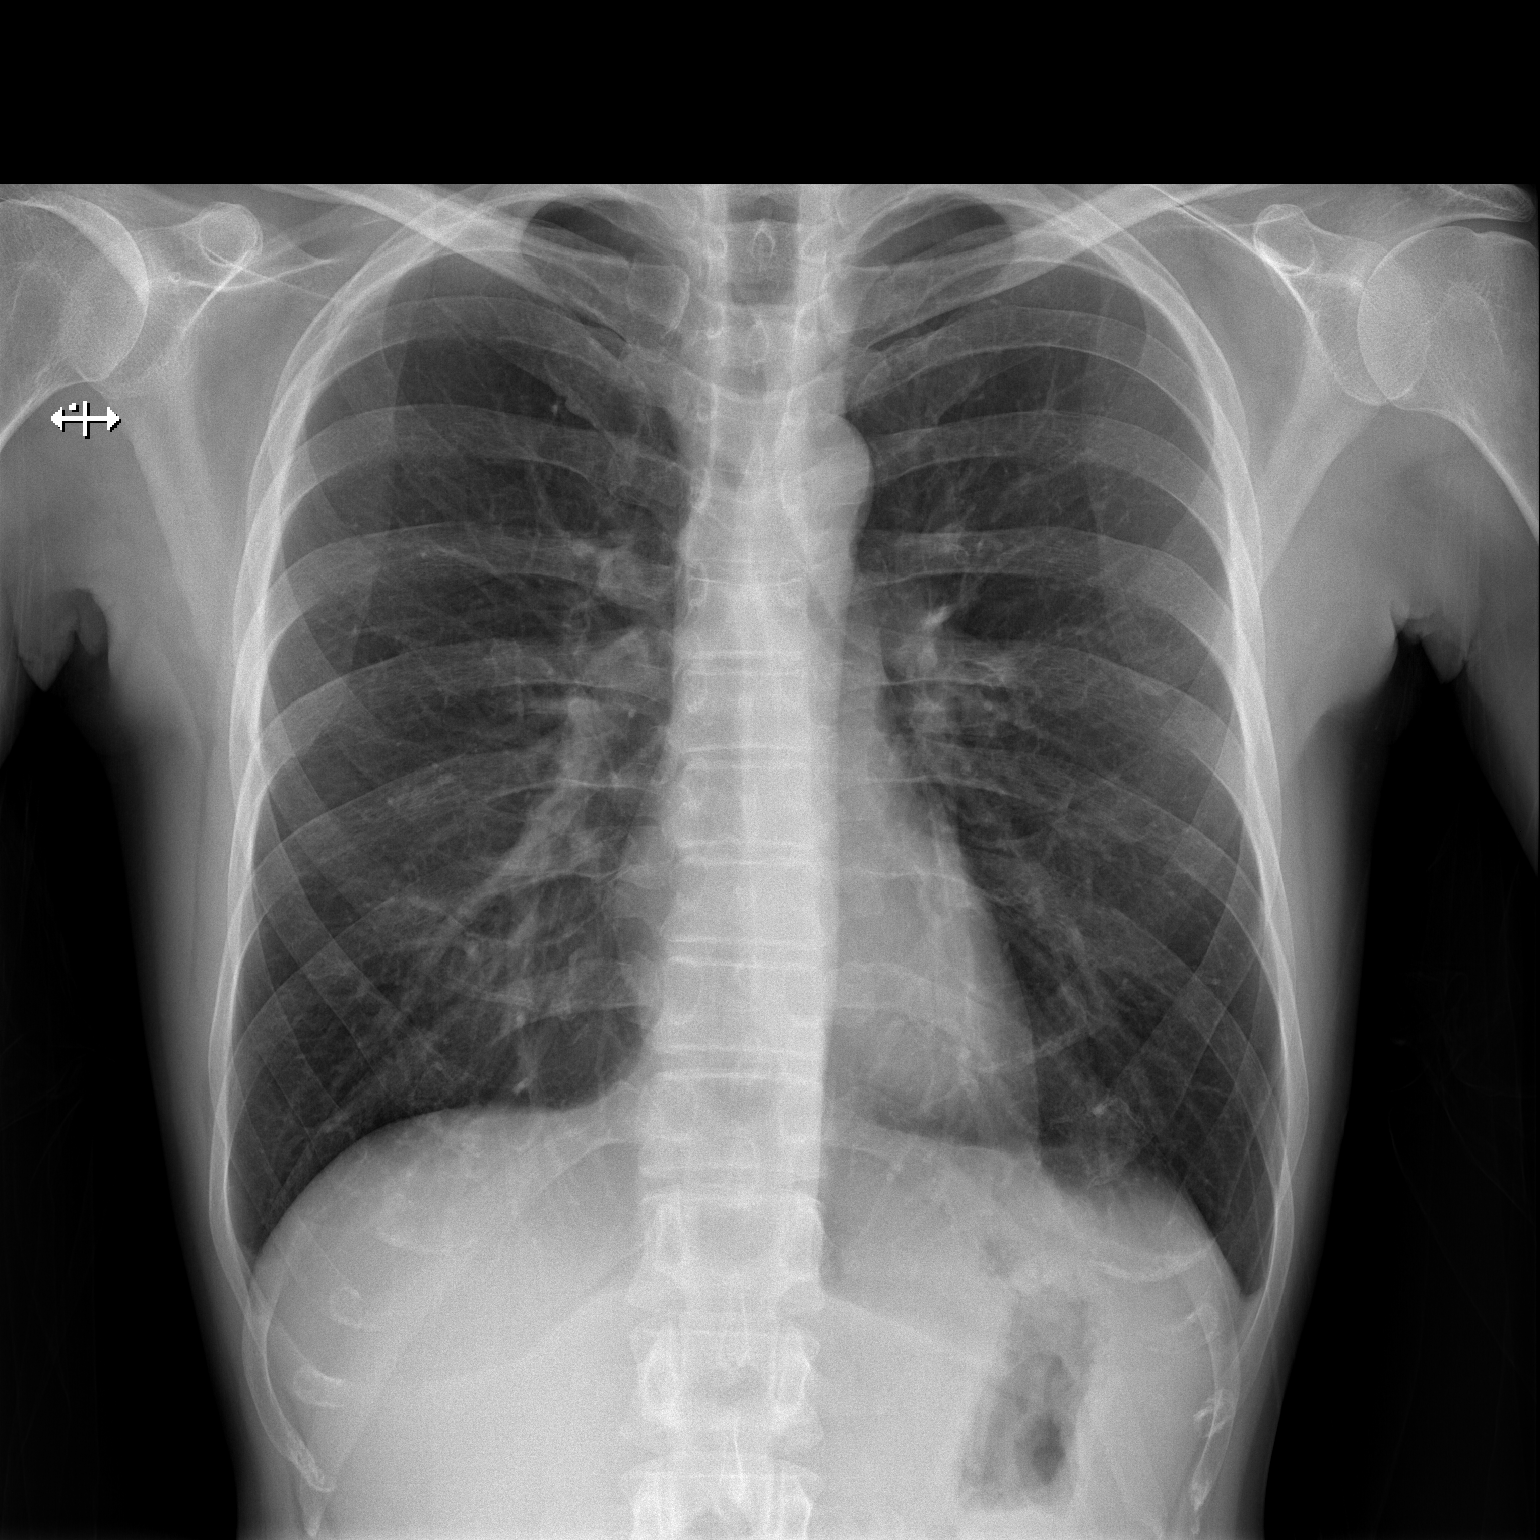

[w chest lat]
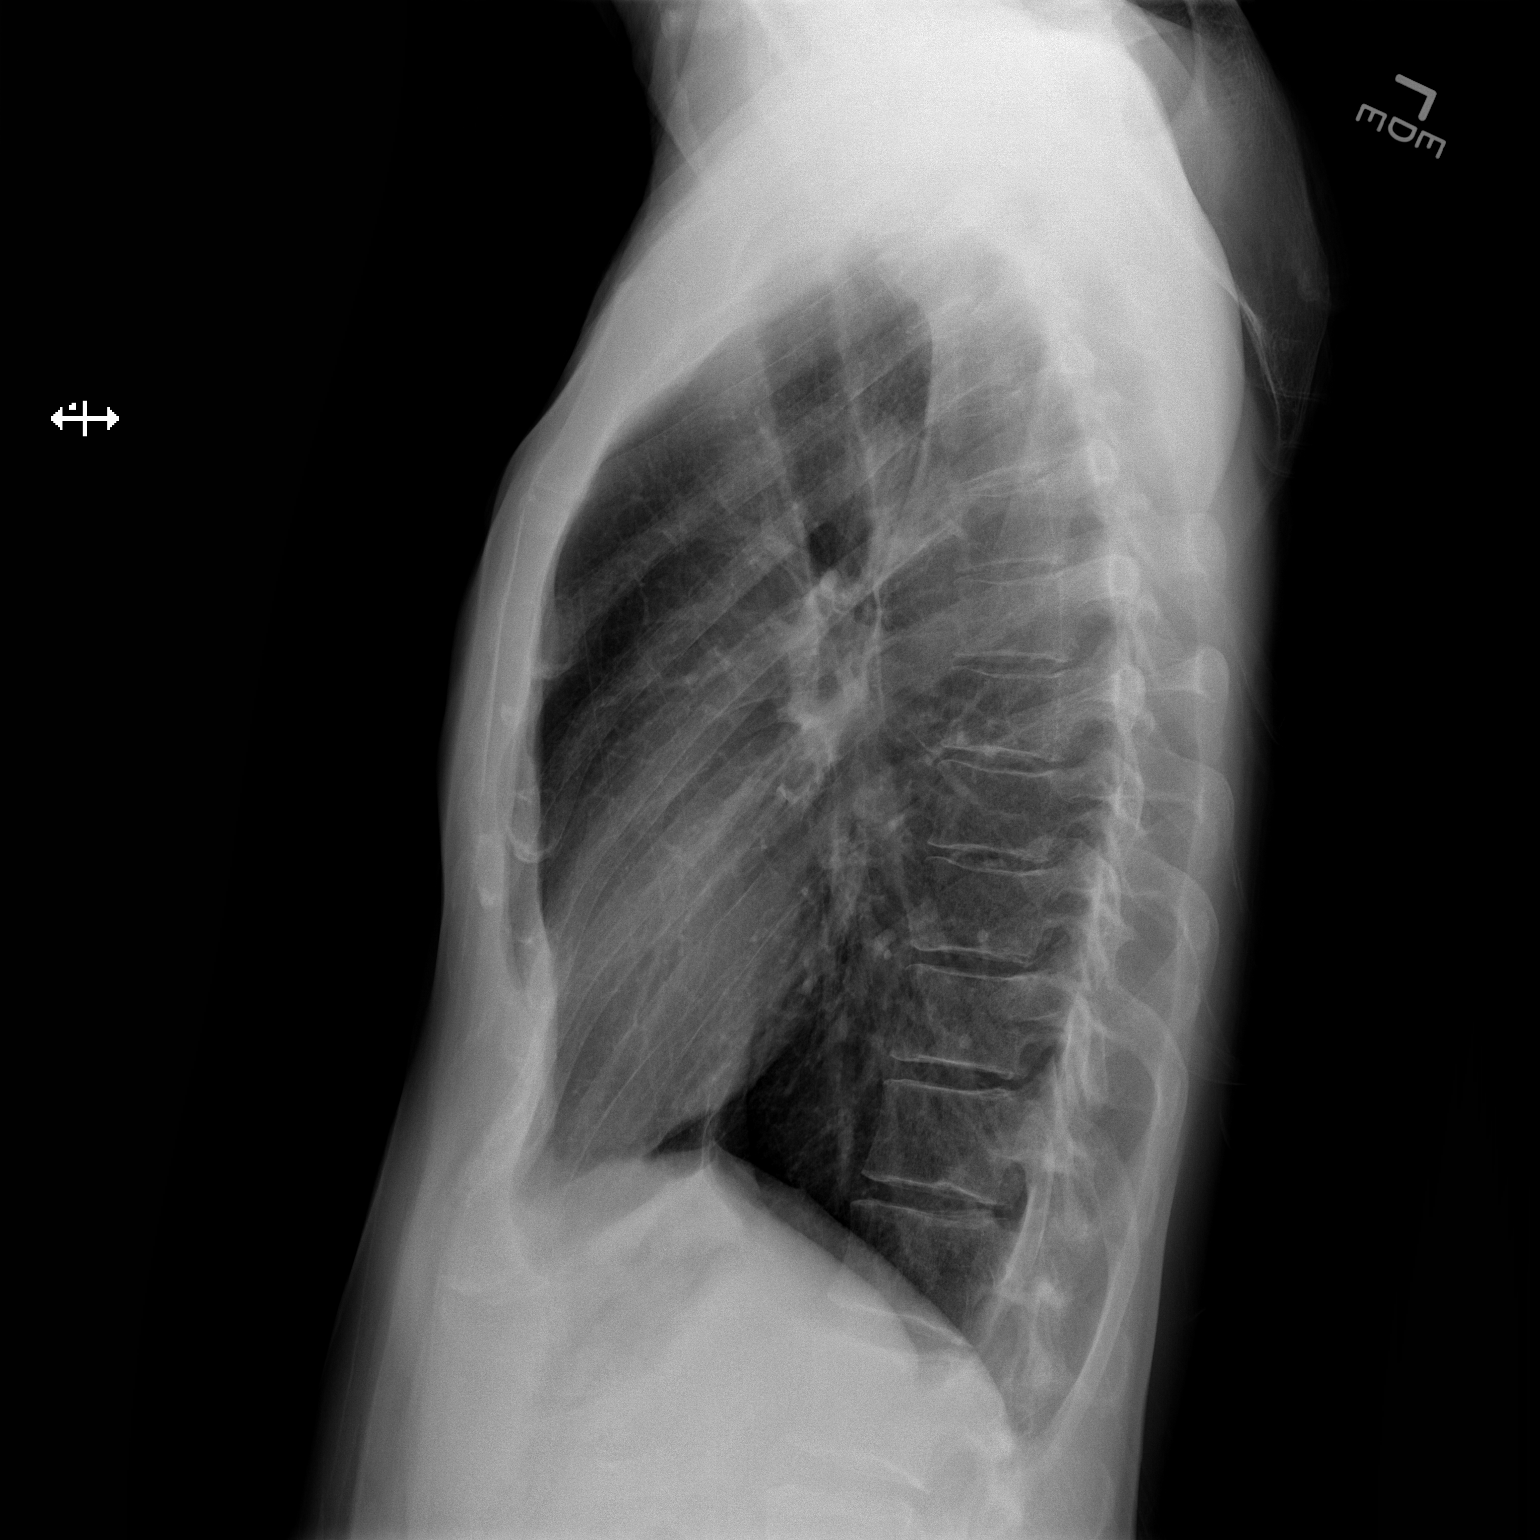

[2 of 2 positions shown; findings below may reference images not displayed]

FINDINGS: The heart size and mediastinal contours are within normal limits.
Both lungs are clear. The visualized skeletal structures are
unremarkable.
IMPRESSION: No active cardiopulmonary disease.

## 2015-09-09 ENCOUNTER — Encounter: Payer: Self-pay | Admitting: Internal Medicine

## 2015-09-09 ENCOUNTER — Ambulatory Visit (INDEPENDENT_AMBULATORY_CARE_PROVIDER_SITE_OTHER): Payer: BLUE CROSS/BLUE SHIELD | Admitting: Internal Medicine

## 2015-09-09 VITALS — BP 114/76 | HR 91 | Temp 97.6°F | Wt 131.5 lb

## 2015-09-09 DIAGNOSIS — R1012 Left upper quadrant pain: Secondary | ICD-10-CM | POA: Diagnosis not present

## 2015-09-09 DIAGNOSIS — R140 Abdominal distension (gaseous): Secondary | ICD-10-CM

## 2015-09-09 DIAGNOSIS — R1011 Right upper quadrant pain: Principal | ICD-10-CM

## 2015-09-09 DIAGNOSIS — R1010 Upper abdominal pain, unspecified: Secondary | ICD-10-CM

## 2015-09-09 DIAGNOSIS — G8929 Other chronic pain: Secondary | ICD-10-CM | POA: Diagnosis not present

## 2015-09-09 LAB — COMPREHENSIVE METABOLIC PANEL
ALBUMIN: 4.4 g/dL (ref 3.5–5.2)
ALT: 24 U/L (ref 0–53)
AST: 21 U/L (ref 0–37)
Alkaline Phosphatase: 81 U/L (ref 39–117)
BUN: 9 mg/dL (ref 6–23)
CHLORIDE: 101 meq/L (ref 96–112)
CO2: 28 meq/L (ref 19–32)
CREATININE: 0.8 mg/dL (ref 0.40–1.50)
Calcium: 9.7 mg/dL (ref 8.4–10.5)
GFR: 110.57 mL/min (ref >60.00)
GLUCOSE: 123 mg/dL — AB (ref 70–99)
POTASSIUM: 4.6 meq/L (ref 3.5–5.1)
SODIUM: 136 meq/L (ref 135–145)
Total Bilirubin: 0.7 mg/dL (ref 0.2–1.2)
Total Protein: 7 g/dL (ref 6.0–8.3)

## 2015-09-09 LAB — AMYLASE: Amylase: 59 U/L (ref 27–131)

## 2015-09-09 LAB — H. PYLORI ANTIBODY, IGG: H Pylori IgG: NEGATIVE

## 2015-09-09 LAB — LIPASE: Lipase: 36 U/L (ref 11.0–59.0)

## 2015-09-09 NOTE — Progress Notes (Signed)
Subjective:    Patient ID: Walter Alvarado, male    DOB: Mar 14, 1971, 45 y.o.   MRN: [id number]  HPI  Pt presents to the clinic today with c/o abdominal pain. This started 8 months ago. The pain is located in the right upper abdomen. He describes the pain as sharp and stabbing. This occurs intermittently, most at night. It wakes him up 1-3 times per month. It is associated with bloating and cold sweats. He denies nausea and vomiting. His bowels are moving normally. He denies issues with urination. He does drink beer daily, but not sure if it is associated. He denies reflux or heartburn. He has not tried anything OTC.  Review of Systems      Past Medical History  Diagnosis Date  . Substance abuse   . Anxiety   . Shortness of breath     2-3 wks ago started sob at bedtime  . 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 Polycythemia   . COPD (chronic obstructive pulmonary disease) ([place])     Current Outpatient Prescriptions  Medication Sig Dispense Refill  . ALPRAZolam (XANAX) 1 MG tablet Take 1 tablet by mouth 4 (four) times daily as needed. Dr. Chucky May, MD  1  . traZODone (DESYREL) 50 MG tablet Take 100 mg by mouth at bedtime.     Marland Kitchen tiotropium (SPIRIVA) 18 MCG inhalation capsule Place 1 capsule (18 mcg total) into inhaler and inhale daily. (Patient not taking: Reported on 09/09/2015) 30 capsule 6   No current facility-administered medications for this visit.    Allergies  Allergen Reactions  . Nsaids     REACTION: INTOLERANT    Family History  Problem Relation Age of Onset  . Cancer Maternal Grandmother     unsure  . Aneurysm Mother   . Sudden death Sister 21    ?aneurysm  . CAD Maternal Grandfather     CABG  . Aneurysm Sister   . Cirrhosis Father     Social History   Social History  . Marital Status: Single    Spouse Name: N/A  . Number of Children: N/A  . Years of Education: N/A   Occupational History  . Not on file.   Social History Main Topics    . Smoking status: Current Every Day Smoker -- 1.50 packs/day for 31 years    Types: Cigarettes  . Smokeless tobacco: Never Used     Comment: 5-6/day  . Alcohol Use: 1.8 oz/week    3 Cans of beer per week     Comment: 3-4 beers per night  . Drug Use: 2.00 per week    Special: Marijuana  . Sexual Activity: Yes   Other Topics Concern  . Not on file   Social History Narrative     Constitutional: Denies fever, malaise, fatigue, headache or abrupt weight changes.  Cardiovascular: Denies chest pain, chest tightness, palpitations or swelling in the hands or feet.  Gastrointestinal: Pt reports abdominal pain, bloating. Denies constipation, diarrhea or blood in the stool.  GU: Denies urgency, frequency, pain with urination, burning sensation, blood in urine, odor or discharge.  No other specific complaints in a complete review of systems (except as listed in HPI above).  Objective:   Physical Exam  BP 114/76 mmHg  Pulse 91  Temp(Src) 97.6 F (36.4 C) (Oral)  Wt 131 lb 8 oz (59.648 kg)  SpO2 95% Wt Readings from Last 3 Encounters:  09/09/15 131 lb 8 oz (  59.648 kg)  06/22/14 133 lb (60.328 kg)  06/18/14 130 lb (58.968 kg)    General: Appears her stated age, in NAD. Skin: Warm, dry and intact.  Cardiovascular: Normal rate and rhythm. S1,S2 noted.  No murmur, rubs or gallops noted.  Pulmonary/Chest: Normal effort and positive vesicular breath sounds. No respiratory distress. No wheezes, rales or ronchi noted.  Abdomen: Soft and nontender. Normal bowel sounds. No distention or masses noted. Liver, spleen and kidneys non palpable.   BMET    Component Value Date/Time   NA 137 06/22/2014 1622   NA 134* 01/14/2013 1908   K 4.7 06/22/2014 1622   K 3.3* 01/14/2013 1908   CL 100 06/22/2014 1622   CL 99 01/14/2013 1908   CO2 30 06/22/2014 1622   CO2 27 01/14/2013 1908   GLUCOSE 125* 06/22/2014 1622   GLUCOSE 107* 01/14/2013 1908   BUN 15 06/22/2014 1622   BUN 9 01/14/2013 1908    CREATININE 1.06 06/22/2014 1622   CREATININE 0.96 01/14/2013 1908   CALCIUM 10.0 06/22/2014 1622   CALCIUM 9.3 01/14/2013 1908   GFRNONAA >90 04/11/2013 1017   GFRNONAA >60 01/14/2013 1908   GFRAA >90 04/11/2013 1017   GFRAA >60 01/14/2013 1908    Lipid Panel     Component Value Date/Time   CHOL 133 08/22/2013 1409   TRIG 81.0 08/22/2013 1409   HDL 57.00 08/22/2013 1409   CHOLHDL 2 08/22/2013 1409   VLDL 16.2 08/22/2013 1409   LDLCALC 60 08/22/2013 1409    CBC    Component Value Date/Time   WBC 9.7 06/22/2014 1622   WBC 7.6 03/17/2013 0757   WBC 12.3* 01/14/2013 1908   RBC 5.11 06/22/2014 1622   RBC 6.09* 03/17/2013 0757   RBC 5.81 01/14/2013 1908   HGB 17.6* 06/22/2014 1622   HGB 20.5* 03/17/2013 0757   HGB 19.9* 01/14/2013 1908   HCT 51.4 06/22/2014 1622   HCT 62.5* 03/17/2013 0757   HCT 58.3* 01/14/2013 1908   PLT 162.0 06/22/2014 1622   PLT 132* 03/17/2013 0757   PLT 136* 01/14/2013 1908   MCV 100.6* 06/22/2014 1622   MCV 102.7* 03/17/2013 0757   MCV 100 01/14/2013 1908   MCH 35.6* 04/11/2013 1017   MCH 33.8* 03/17/2013 0757   MCH 34.3* 01/14/2013 1908   MCHC 34.3 06/22/2014 1622   MCHC 32.9 03/17/2013 0757   MCHC 34.2 01/14/2013 1908   RDW 14.6 06/22/2014 1622   RDW 14.1 03/17/2013 0757   RDW 13.9 01/14/2013 1908   LYMPHSABS 1.8 04/11/2013 1017   LYMPHSABS 1.7 03/17/2013 0757   MONOABS 0.5 04/11/2013 1017   MONOABS 0.6 03/17/2013 0757   EOSABS 0.0 04/11/2013 1017   EOSABS 0.1 03/17/2013 0757   BASOSABS 0.0 04/11/2013 1017   BASOSABS 0.0 03/17/2013 0757    Hgb A1C No results found for: HGBA1C       Assessment & Plan:   Left and Right upper quadrant abdominal pain and bloating:  Has had cholecystectomy Hx of alcohol abuse- encouraged him to cut back on this Will check CMET, Amylase, Lipase and H Pylori today Avoid NSAID's Consider ultrasound of abdomen vs CT scan of abdomen if pain persist or worsens  Will follow up after labs, make  an appt for your annual exam

## 2015-09-09 NOTE — Progress Notes (Signed)
Pre visit review using our clinic review tool, if applicable. No additional management support is needed unless otherwise documented below in the visit note. 

## 2015-09-09 NOTE — Patient Instructions (Signed)

## 2020-04-05 ENCOUNTER — Other Ambulatory Visit (HOSPITAL_COMMUNITY): Payer: Self-pay | Admitting: Radiology

## 2020-08-20 ENCOUNTER — Telehealth: Payer: Self-pay

## 2020-08-20 NOTE — Telephone Encounter (Signed)
error 

## 2021-09-12 ENCOUNTER — Encounter: Payer: BLUE CROSS/BLUE SHIELD | Admitting: Family

## 2022-04-24 DIAGNOSIS — Z419 Encounter for procedure for purposes other than remedying health state, unspecified: Secondary | ICD-10-CM | POA: Diagnosis not present

## 2022-05-25 DIAGNOSIS — Z419 Encounter for procedure for purposes other than remedying health state, unspecified: Secondary | ICD-10-CM | POA: Diagnosis not present

## 2022-06-23 DIAGNOSIS — Z419 Encounter for procedure for purposes other than remedying health state, unspecified: Secondary | ICD-10-CM | POA: Diagnosis not present

## 2022-07-24 DIAGNOSIS — Z419 Encounter for procedure for purposes other than remedying health state, unspecified: Secondary | ICD-10-CM | POA: Diagnosis not present

## 2022-08-23 DIAGNOSIS — Z419 Encounter for procedure for purposes other than remedying health state, unspecified: Secondary | ICD-10-CM | POA: Diagnosis not present

## 2022-09-23 DIAGNOSIS — Z419 Encounter for procedure for purposes other than remedying health state, unspecified: Secondary | ICD-10-CM | POA: Diagnosis not present

## 2022-10-23 DIAGNOSIS — Z419 Encounter for procedure for purposes other than remedying health state, unspecified: Secondary | ICD-10-CM | POA: Diagnosis not present

## 2022-11-23 DIAGNOSIS — Z419 Encounter for procedure for purposes other than remedying health state, unspecified: Secondary | ICD-10-CM | POA: Diagnosis not present

## 2022-12-24 DIAGNOSIS — Z419 Encounter for procedure for purposes other than remedying health state, unspecified: Secondary | ICD-10-CM | POA: Diagnosis not present

## 2023-01-23 DIAGNOSIS — Z419 Encounter for procedure for purposes other than remedying health state, unspecified: Secondary | ICD-10-CM | POA: Diagnosis not present

## 2023-02-03 ENCOUNTER — Inpatient Hospital Stay (HOSPITAL_COMMUNITY)
Admission: EM | Admit: 2023-02-03 | Discharge: 2023-02-05 | DRG: 522 | Disposition: A | Discharge status: Home / Self Care | Payer: BLUE CROSS/BLUE SHIELD | Attending: Internal Medicine | Admitting: Internal Medicine

## 2023-02-03 ENCOUNTER — Emergency Department (HOSPITAL_COMMUNITY): Payer: BLUE CROSS/BLUE SHIELD

## 2023-02-03 ENCOUNTER — Encounter (HOSPITAL_COMMUNITY): Payer: Self-pay | Admitting: Internal Medicine

## 2023-02-03 DIAGNOSIS — Y92512 Supermarket, store or market as the place of occurrence of the external cause: Secondary | ICD-10-CM | POA: Diagnosis not present

## 2023-02-03 DIAGNOSIS — J449 Chronic obstructive pulmonary disease, unspecified: Secondary | ICD-10-CM | POA: Diagnosis present

## 2023-02-03 DIAGNOSIS — F419 Anxiety disorder, unspecified: Secondary | ICD-10-CM | POA: Diagnosis present

## 2023-02-03 DIAGNOSIS — S72002A Fracture of unspecified part of neck of left femur, initial encounter for closed fracture: Principal | ICD-10-CM | POA: Diagnosis present

## 2023-02-03 DIAGNOSIS — W010XXA Fall on same level from slipping, tripping and stumbling without subsequent striking against object, initial encounter: Secondary | ICD-10-CM | POA: Diagnosis present

## 2023-02-03 DIAGNOSIS — I739 Peripheral vascular disease, unspecified: Secondary | ICD-10-CM | POA: Diagnosis present

## 2023-02-03 DIAGNOSIS — M25552 Pain in left hip: Secondary | ICD-10-CM | POA: Diagnosis present

## 2023-02-03 DIAGNOSIS — Z8249 Family history of ischemic heart disease and other diseases of the circulatory system: Secondary | ICD-10-CM | POA: Diagnosis not present

## 2023-02-03 DIAGNOSIS — F17210 Nicotine dependence, cigarettes, uncomplicated: Secondary | ICD-10-CM | POA: Diagnosis present

## 2023-02-03 DIAGNOSIS — W19XXXA Unspecified fall, initial encounter: Secondary | ICD-10-CM

## 2023-02-03 DIAGNOSIS — M810 Age-related osteoporosis without current pathological fracture: Secondary | ICD-10-CM | POA: Diagnosis present

## 2023-02-03 DIAGNOSIS — Z79899 Other long term (current) drug therapy: Secondary | ICD-10-CM | POA: Diagnosis not present

## 2023-02-03 DIAGNOSIS — Z720 Tobacco use: Secondary | ICD-10-CM | POA: Diagnosis present

## 2023-02-03 DIAGNOSIS — S72092A Other fracture of head and neck of left femur, initial encounter for closed fracture: Secondary | ICD-10-CM | POA: Diagnosis present

## 2023-02-03 DIAGNOSIS — Z96642 Presence of left artificial hip joint: Secondary | ICD-10-CM

## 2023-02-03 LAB — BASIC METABOLIC PANEL
Anion gap: 9 (ref 5–15)
BUN: 13 mg/dL (ref 6–20)
CO2: 26 mmol/L (ref 22–32)
Calcium: 8.8 mg/dL — ABNORMAL LOW (ref 8.9–10.3)
Chloride: 102 mmol/L (ref 98–111)
Creatinine, Ser: 0.82 mg/dL (ref 0.61–1.24)
GFR, Estimated: >60 mL/min (ref >60)
Glucose, Bld: 101 mg/dL — ABNORMAL HIGH (ref 70–99)
Potassium: 3.6 mmol/L (ref 3.5–5.1)
Sodium: 137 mmol/L (ref 135–145)

## 2023-02-03 LAB — CBC WITH DIFFERENTIAL/PLATELET
Abs Immature Granulocytes: 0.07 10*3/uL (ref 0.00–0.07)
Basophils Absolute: 0 10*3/uL (ref 0.0–0.1)
Basophils Relative: 0 %
Eosinophils Absolute: 0.1 10*3/uL (ref 0.0–0.5)
Eosinophils Relative: 1 %
HCT: 44.9 % (ref 39.0–52.0)
Hemoglobin: 15.1 g/dL (ref 13.0–17.0)
Immature Granulocytes: 1 %
Lymphocytes Relative: 19 %
Lymphs Abs: 2.3 10*3/uL (ref 0.7–4.0)
MCH: 34 pg (ref 26.0–34.0)
MCHC: 33.6 g/dL (ref 30.0–36.0)
MCV: 101.1 fL — ABNORMAL HIGH (ref 80.0–100.0)
Monocytes Absolute: 0.8 10*3/uL (ref 0.1–1.0)
Monocytes Relative: 7 %
Neutro Abs: 8.9 10*3/uL — ABNORMAL HIGH (ref 1.7–7.7)
Neutrophils Relative %: 72 %
Platelets: 155 10*3/uL (ref 150–400)
RBC: 4.44 MIL/uL (ref 4.22–5.81)
RDW: 12.7 % (ref 11.5–15.5)
WBC: 12.1 10*3/uL — ABNORMAL HIGH (ref 4.0–10.5)
nRBC: 0 % (ref 0.0–0.2)

## 2023-02-03 LAB — TYPE AND SCREEN
ABO/RH(D): A POS
Antibody Screen: NEGATIVE

## 2023-02-03 LAB — PROTIME-INR
INR: 1 (ref 0.8–1.2)
Prothrombin Time: 13 s (ref 11.4–15.2)

## 2023-02-03 MED ORDER — BUDESON-GLYCOPYRROL-FORMOTEROL 160-9-4.8 MCG/ACT IN AERO: 1.0000 | INHALATION_SPRAY | Freq: Two times a day (BID) | RESPIRATORY_TRACT | Status: DC

## 2023-02-03 MED ORDER — MORPHINE SULFATE (PF) 2 MG/ML IV SOLN
0.5000 mg | INTRAVENOUS | Status: DC | PRN
  Administered 2023-02-03: 0.5 mg via INTRAVENOUS
  Filled 2023-02-03: qty 1

## 2023-02-03 MED ORDER — HYDROCODONE-ACETAMINOPHEN 5-325 MG PO TABS
1.0000 | ORAL_TABLET | Freq: Four times a day (QID) | ORAL | Status: DC | PRN
  Administered 2023-02-04: 2 via ORAL
  Filled 2023-02-03: qty 2

## 2023-02-03 MED ORDER — TRAMADOL HCL 50 MG PO TABS
50.0000 mg | ORAL_TABLET | Freq: Once | ORAL | Status: AC
  Administered 2023-02-03: 50 mg via ORAL
  Filled 2023-02-03: qty 1

## 2023-02-03 MED ORDER — ALBUTEROL SULFATE (2.5 MG/3ML) 0.083% IN NEBU: 3.0000 mL | INHALATION_SOLUTION | Freq: Four times a day (QID) | RESPIRATORY_TRACT | Status: DC | PRN

## 2023-02-03 MED ORDER — NICOTINE 21 MG/24HR TD PT24
21.0000 mg | MEDICATED_PATCH | Freq: Every day | TRANSDERMAL | Status: DC
  Administered 2023-02-03 – 2023-02-04 (×2): 21 mg via TRANSDERMAL
  Filled 2023-02-03 (×2): qty 1

## 2023-02-03 MED ORDER — SENNOSIDES-DOCUSATE SODIUM 8.6-50 MG PO TABS: 1.0000 | ORAL_TABLET | Freq: Every evening | ORAL | Status: DC | PRN

## 2023-02-03 MED ORDER — BISACODYL 5 MG PO TBEC: 5.0000 mg | DELAYED_RELEASE_TABLET | Freq: Every day | ORAL | Status: DC | PRN

## 2023-02-03 MED ORDER — ALPRAZOLAM 0.5 MG PO TABS
1.0000 mg | ORAL_TABLET | Freq: Four times a day (QID) | ORAL | Status: DC | PRN
  Administered 2023-02-03 – 2023-02-05 (×2): 1 mg via ORAL
  Filled 2023-02-03 (×3): qty 2

## 2023-02-03 MED ORDER — FLUTICASONE FUROATE-VILANTEROL 100-25 MCG/ACT IN AEPB
1.0000 | INHALATION_SPRAY | Freq: Every day | RESPIRATORY_TRACT | Status: DC
  Administered 2023-02-04 – 2023-02-05 (×2): 1 via RESPIRATORY_TRACT
  Filled 2023-02-03: qty 28

## 2023-02-03 MED ORDER — ONDANSETRON HCL 4 MG/2ML IJ SOLN: 4.0000 mg | Freq: Four times a day (QID) | INTRAMUSCULAR | Status: DC | PRN

## 2023-02-03 MED ORDER — MORPHINE SULFATE (PF) 4 MG/ML IV SOLN
4.0000 mg | Freq: Once | INTRAVENOUS | Status: AC
  Administered 2023-02-03: 4 mg via INTRAVENOUS
  Filled 2023-02-03: qty 1

## 2023-02-03 MED ORDER — TRAZODONE HCL 100 MG PO TABS
200.0000 mg | ORAL_TABLET | Freq: Every day | ORAL | Status: DC
  Administered 2023-02-03 – 2023-02-04 (×2): 200 mg via ORAL
  Filled 2023-02-03 (×2): qty 2

## 2023-02-03 MED ORDER — UMECLIDINIUM BROMIDE 62.5 MCG/ACT IN AEPB
1.0000 | INHALATION_SPRAY | Freq: Every day | RESPIRATORY_TRACT | Status: DC
  Administered 2023-02-04 – 2023-02-05 (×2): 1 via RESPIRATORY_TRACT
  Filled 2023-02-03: qty 7

## 2023-02-03 MED ORDER — ACETAMINOPHEN 325 MG PO TABS: 650.0000 mg | ORAL_TABLET | Freq: Four times a day (QID) | ORAL | Status: DC | PRN

## 2023-02-03 NOTE — Hospital Course (Signed)
Walter Alvarado is a 52 y.o. male with medical history significant for COPD, anxiety, osteoporosis, tobacco use who is admitted with a left hip fracture after mechanical fall. He underwent left total hip replacement on 02/04/2023.  He ambulated well the day following surgery with physical therapy and was considered stable for discharging home.  He will follow-up outpatient with orthopedic surgery.

## 2023-02-03 NOTE — Assessment & Plan Note (Signed)
Stable without wheezing on admission.  Saturating well on room air.  Continue Breztri and albuterol as needed.

## 2023-02-03 NOTE — Consult Note (Signed)
Reason for Consult:Left hip fracture Referring Physician: EDP   Walter Alvarado is an 52 y.o. male.  HPI: 52 yo male fell today on left hip c/o pain.   Past Medical History:  Diagnosis Date   Anxiety    Blue toe syndrome (H[facility]) 2010   right big toe   COPD (chronic obstructive pulmonary disease) ([facility])    Polycythemia    Shortness of breath    2-3 wks ago started sob at bedtime   Substance abuse [facility])     Past Surgical History:  Procedure Laterality Date   CHOLECYSTECTOMY N/A 04/16/2013   Procedure: LAPAROSCOPIC CHOLECYSTECTOMY WITH INTRAOPERATIVE CHOLANGIOGRAM;  Surgeon: Clovis Pu. Cornett, MD;  Location: [facility] OR;  Service: General;  Laterality: N/A;   EXTERNAL EAR SURGERY Right 1989   KNEE ARTHROSCOPY Left    took all the  cartlige out   KNEE ARTHROSCOPY Right    LEG SURGERY     broke leg   LEG SURGERY Left    placed metal rod (femur)after 6 months took it out   SHOULDER ARTHROSCOPY Right     Family History  Problem Relation Age of Onset   Cancer Maternal Grandmother        unsure   Aneurysm Mother    Sudden death Sister 60       ?aneurysm   CAD Maternal Grandfather        CABG   Aneurysm Sister    Cirrhosis Father     Social History:  reports that he has been smoking cigarettes. He has a 46.5 pack-year smoking history. He has never used smokeless tobacco. He reports current alcohol use of about 3.0 standard drinks of alcohol per week. He reports current drug use. Frequency: 2.00 times per week. Drug: Marijuana.  Allergies:  Allergies  Allergen Reactions   Nsaids     REACTION: INTOLERANT    Medications: I have reviewed the patient's current medications.  No results found for this or any previous visit (from the past 48 hour(s)).  DG Hip Unilat W or Wo Pelvis 2-3 Views Left  Result Date: 02/03/2023 CLINICAL DATA:  Fall.  Left hip pain.  Unable to ambulate. EXAM: DG HIP (WITH OR WITHOUT PELVIS) 2-3V LEFT COMPARISON:  None Available. FINDINGS: A nondisplaced  left femoral neck fracture is present. Acetabulum is intact. The distal femur is unremarkable. The bony pelvis is within normal limits. IMPRESSION: Nondisplaced left femoral neck fracture. Electronically Signed   By: Marin Roberts M.D.   On: 02/03/2023 16:30    Review of Systems  Musculoskeletal:  Positive for falls, joint pain and myalgias.  All other systems reviewed and are negative.  Blood pressure (!) 127/92, pulse 71, temperature 97.8 F (36.6 C), temperature source Oral, resp. rate 18, height 5\' 10"  (1.778 m), weight 57.2 kg, SpO2 100%. Physical Exam HENT:     Head: Normocephalic.  Eyes:     Pupils: Pupils are equal, round, and reactive to light.  Cardiovascular:     Rate and Rhythm: Normal rate.  Pulmonary:     Effort: Pulmonary effort is normal.  Abdominal:     Palpations: Abdomen is soft.  Musculoskeletal:        General: Tenderness present.     Comments: Pain left hip with attempted motion. No DVT  Skin:    General: Skin is warm and dry.     Capillary Refill: Capillary refill takes less than 2 seconds.  Neurological:     General: No focal deficit present.  Mental Status: He is alert.  Psychiatric:        Mood and Affect: Mood normal.        Behavior: Behavior normal.     Assessment/Plan:  Left mildly displaced femoral neck fracture Admit to Medicine service.NPO after midnight. Potential surgery tomorrow.        ORIF vs Arthroplasty. 3.   I discussed with Dr. Charlann Boxer.  He will perform the surgery.  Anticipated time posted in the operating room is 11 AM  Javier Docker 02/03/2023, 5:03 PM
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
## 2023-02-03 NOTE — Assessment & Plan Note (Signed)
Occurring after mechanical fall.  Orthopedics consulted, patient is scheduled for surgical fixation tomorrow 10/13.  Patient is considered low risk perioperative candidate from medical perspective. -Keep n.p.o. after midnight -Continue analgesics as needed -Scheduled for left THA with Dr. Charlann Boxer 10/13

## 2023-02-03 NOTE — Assessment & Plan Note (Signed)
Continue chronic home Xanax 1 mg 4 times daily as needed.  Continue home trazodone for sleep.

## 2023-02-03 NOTE — ED Provider Notes (Signed)
[place] EMERGENCY DEPARTMENT AT [facility] Provider Note   CSN: [id number] Arrival date & time: 02/03/23  1439     History  Chief Complaint  Patient presents with   Fall   Hip Pain    Walter Alvarado is a 52 y.o. male here presenting with fall.  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  He states that he landed on his left hip.  He states that he has trouble walking afterwards.  He did have head injury but denies any loss of consciousness or vomiting.  The history is provided by the patient.       Home Medications Prior to Admission medications   Medication Sig Start Date End Date Taking? Authorizing Provider  ALPRAZolam Prudy Feeler) 1 MG tablet Take 1 tablet by mouth 4 (four) times daily as needed. Dr. Milagros Evener, MD 08/16/15   [provider]  tiotropium (SPIRIVA) 18 MCG inhalation capsule Place 1 capsule (18 mcg total) into inhaler and inhale daily. Patient not taking: Reported on 09/09/2015 06/18/14   Lorre Munroe, NP  traZODone (DESYREL) 50 MG tablet Take 100 mg by mouth at bedtime.  01/13/13   [provider]      Allergies    Nsaids    Review of Systems   Review of Systems  Musculoskeletal:        Left hip pain  All other systems reviewed and are negative.   Physical Exam Updated Vital Signs BP (!) 127/92 (BP Location: Left Arm)   Pulse 71   Temp 97.8 F (36.6 C) (Oral)   Resp 18   Ht 5\' 10"  (1.778 m)   Wt 57.2 kg   SpO2 100%   BMI 18.08 kg/m  Physical Exam Vitals and nursing note reviewed.  Constitutional:      Appearance: Normal appearance.  HENT:     Head: Normocephalic.     Comments: No obvious scalp hematoma    Nose: Nose normal.     Mouth/Throat:     Mouth: Mucous membranes are moist.  Eyes:     Extraocular Movements: Extraocular movements intact.     Pupils: Pupils are equal, round, and reactive to light.  Cardiovascular:     Rate and Rhythm: Normal rate and regular rhythm.      Pulses: Normal pulses.     Heart sounds: Normal heart sounds.  Pulmonary:     Effort: Pulmonary effort is normal.     Breath sounds: Normal breath sounds.  Abdominal:     General: Abdomen is flat.     Palpations: Abdomen is soft.  Musculoskeletal:     Cervical back: Normal range of motion and neck supple.     Comments: Mild left hip tenderness.  No obvious spinal tenderness.  Neurological:     General: No focal deficit present.     Mental Status: He is alert and oriented to person, place, and time.  Psychiatric:        Mood and Affect: Mood normal.        Behavior: Behavior normal.     ED Results / Procedures / Treatments   Labs (all labs ordered are listed, but only abnormal results are displayed) Labs Reviewed - No data to display  EKG None  Radiology No results found.  Procedures Procedures    Medications Ordered in ED Medications  traMADol (ULTRAM) tablet 50 mg (has no administration in time range)    ED Course/ Medical Decision Making/  A&P                                 Medical Decision Making Walter Alvarado is a 52 y.o. male here presenting with fall.  Patient had mechanical fall onto the left hip.  Will get x-ray to rule out fracture.  5:21 PM X-ray showed left femoral neck fracture.  Discussed with Dr. Shelle Iron from ortho.  He recommend hospitalist admission and n.p.o. after midnight.  Will do CT head to rule out a bleed since he had head injury.  6:44 PM Reviewed patient's labs and they were unremarkable.  CT head unremarkable.  Hospitalist to admit for L hip fracture.    Problems Addressed: Closed fracture of left hip, initial encounter [facility]): acute illness or injury Fall, initial encounter: acute illness or injury  Amount and/or Complexity of Data Reviewed Labs: ordered. Decision-making details documented in ED Course. Radiology: ordered and independent interpretation performed. Decision-making details documented in ED Course.  Risk Prescription  drug management. Decision regarding hospitalization.    Final Clinical Impression(s) / ED Diagnoses Final diagnoses:  None    Rx / DC Orders ED Discharge Orders     None         Charlynne Pander, MD 02/03/23 1845

## 2023-02-03 NOTE — H&P (Signed)
History and Physical    Walter Alvarado ZOX:[id number] DOB: December 06, 1970 DOA: 02/03/2023  PCP: [facility]  Patient coming from: Home  I have personally briefly reviewed patient's old medical records in [facility]  Chief Complaint: Left hip pain after a fall  HPI: Walter Alvarado is a 52 y.o. male with medical history significant for COPD, anxiety, osteoporosis, tobacco use who presented to the ED for evaluation of left hip pain after a fall.  Patient states he was born a pack of cigarettes earlier today.  He was at the [employer] and took a step back when he tripped over someone and fell onto the ground landing on his left hip.  He had significant pain.  He had to be helped up but did walk out of the store.  He had continued severe pain and difficulty walking therefore came to the ED for further evaluation.  He denies any recent chest pain, dyspnea, fevers, chills, loss of consciousness, nausea, vomiting.  He is a chronic smoker of 2 PPD.  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  He was recently diagnosed with osteoporosis within the last 2 weeks and has been prescribed Fosamax.  ED Course  Labs/Imaging on admission: I have personally reviewed following labs and imaging studies.  Initial vitals showed BP 127/92, pulse 71, RR 18, temp 97.8 F, SpO2 100% on room air.  Labs show WBC 12.1, hemoglobin 15.1, platelets 155,000, sodium 137, potassium 3.6, bicarb 26, BUN 13, creatinine 0.82, serum glucose 101.  Left hip x-ray showed a nondisplaced left femoral neck fracture.  Portable chest x-ray negative for focal consolidation, edema, effusion.  CT head without contrast negative for acute intracranial abnormality.  Patient was given IV morphine 4 mg, tramadol.  EDP discussed with orthopedics Dr. Shelle Iron who recommended medical admission and plan for potential surgery tomorrow.  The hospitalist service was consulted to admit.  Review of Systems: All  systems reviewed and are negative except as documented in history of present illness above.   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 Cornett, MD;  Location: [facility] OR;  Service: General;  Laterality: N/A;   EXTERNAL EAR SURGERY Right 1989   KNEE ARTHROSCOPY Left    took all the  cartlige out   KNEE ARTHROSCOPY Right    LEG SURGERY     broke leg   LEG SURGERY Left    placed metal rod (femur)after 6 months took it out   SHOULDER ARTHROSCOPY Right     Social History:  reports that he has been smoking cigarettes. He has a 46.5 pack-year smoking history. He has never used smokeless tobacco. He reports current alcohol use of about 3.0 standard drinks of alcohol per week. He reports current drug use. Frequency: 2.00 times per week. Drug: Marijuana.  Allergies  Allergen Reactions   Nsaids     REACTION: INTOLERANT    Family History  Problem Relation Age of Onset   Cancer Maternal Grandmother        unsure  Aneurysm Mother    Sudden death Sister 44       ?aneurysm   CAD Maternal Grandfather        CABG   Aneurysm Sister    Cirrhosis Father      Prior to Admission medications   Medication Sig Start Date End Date Taking? Authorizing Provider  ALPRAZolam Prudy Feeler) 1 MG tablet Take 1 tablet by mouth 4 (four) times daily as needed. Dr. Milagros Evener, MD 08/16/15   [provider]  tiotropium (SPIRIVA) 18 MCG inhalation capsule Place 1 capsule (18 mcg total) into inhaler and inhale daily. Patient not taking: Reported on 09/09/2015 06/18/14   Lorre Munroe, NP  traZODone (DESYREL) 50  MG tablet Take 100 mg by mouth at bedtime.  01/13/13   [provider]    Physical Exam: Vitals:   02/03/23 1500 02/03/23 1747  BP: (!) 127/92 (!) 147/101  Pulse: 71 73  Resp: 18   Temp: 97.8 F (36.6 C)   TempSrc: Oral   SpO2: 100% 99%  Weight: 57.2 kg   Height: 5\' 10"  (1.778 m)    Constitutional: Resting in bed with head elevated, NAD, calm, comfortable Eyes: EOMI, lids and conjunctivae normal ENMT: Mucous membranes are moist. Posterior pharynx clear of any exudate or lesions.Normal dentition.  Neck: normal, supple, no masses. Respiratory: clear to auscultation bilaterally, no wheezing, no crackles. Normal respiratory effort. No accessory muscle use.  Cardiovascular: Regular rate and rhythm, no murmurs / rubs / gallops. No extremity edema. 2+ pedal pulses. Abdomen: no tenderness, no masses palpated.  Musculoskeletal: no clubbing / cyanosis.  ROM diminished left hip due to hip fracture otherwise intact other extremities. Skin: no rashes, lesions, ulcers. No induration Neurologic: Sensation intact. Strength limited LLE due to hip fracture otherwise 5/5 all other extremities. Psychiatric: Normal judgment and insight. Alert and oriented x 3. Normal mood.   EKG: Ordered and pending.  Assessment/Plan Principal Problem:   Closed fracture of neck of left femur (HCC) Active Problems:   COPD (chronic obstructive pulmonary disease) (HCC)   Tobacco use   Anxiety   Walter Alvarado is a 52 y.o. male with medical history significant for COPD, anxiety, osteoporosis, tobacco use who is admitted with a left hip fracture after mechanical fall.  Assessment and Plan: * Closed fracture of neck of left femur (HCC) Occurring after mechanical fall.  Orthopedics consulted, patient is scheduled for surgical fixation tomorrow 10/13.  Patient is considered low risk perioperative candidate from medical perspective. -Keep n.p.o. after midnight -Continue analgesics as needed -Scheduled for left  THA with Dr. Charlann Boxer 10/13  COPD (chronic obstructive pulmonary disease) (HCC) Stable without wheezing on admission.  Saturating well on room air.  Continue Breztri and albuterol as needed.  Anxiety Continue chronic home Xanax 1 mg 4 times daily as needed.  Continue home trazodone for sleep.  Tobacco use Patient reports smoking 2 PPD.  Smoking cessation recommended.  Nicotine patch provided.  DVT prophylaxis: SCDs Start: 02/03/23 1925 Code Status: Full code, confirmed with patient on admission Family Communication: Discussed with patient, he has discussed with family Disposition Plan: From home, dispo pending clinical progress Consults called: Orthopedics Severity of Illness: The appropriate patient status for this patient is INPATIENT. Inpatient status is judged to be reasonable and necessary in order to provide the required intensity of service to ensure the patient's safety. The patient's presenting symptoms, physical exam findings, and initial radiographic and laboratory data in the context of their chronic comorbidities is felt to  place them at high risk for further clinical deterioration. Furthermore, it is not anticipated that the patient will be medically stable for discharge from the hospital within 2 midnights of admission.   * I certify that at the point of admission it is my clinical judgment that the patient will require inpatient hospital care spanning beyond 2 midnights from the point of admission due to high intensity of service, high risk for further deterioration and high frequency of surveillance required.Darreld Mclean MD [facility]  If 7PM-7AM, please contact night-coverage [email or web address]  02/03/2023, 7:35 PM

## 2023-02-03 NOTE — Assessment & Plan Note (Signed)
Patient reports smoking 2 PPD.  Smoking cessation recommended.  Nicotine patch provided.

## 2023-02-03 NOTE — ED Triage Notes (Signed)
Patient reports he fell 1 hour ago, tripped over another human Landed on left side Left hip pain 10/10  Says he can't walk or move or pick up off ground

## 2023-02-04 ENCOUNTER — Encounter (HOSPITAL_COMMUNITY)
Admission: EM | Disposition: A | Discharge status: Home / Self Care | Payer: Self-pay | Source: Home / Self Care | Attending: Internal Medicine

## 2023-02-04 ENCOUNTER — Inpatient Hospital Stay (HOSPITAL_COMMUNITY): Payer: BLUE CROSS/BLUE SHIELD

## 2023-02-04 ENCOUNTER — Other Ambulatory Visit: Payer: Self-pay

## 2023-02-04 ENCOUNTER — Inpatient Hospital Stay (HOSPITAL_COMMUNITY): Payer: BLUE CROSS/BLUE SHIELD | Admitting: Anesthesiology

## 2023-02-04 DIAGNOSIS — J449 Chronic obstructive pulmonary disease, unspecified: Secondary | ICD-10-CM | POA: Diagnosis not present

## 2023-02-04 DIAGNOSIS — S72002A Fracture of unspecified part of neck of left femur, initial encounter for closed fracture: Secondary | ICD-10-CM | POA: Diagnosis not present

## 2023-02-04 DIAGNOSIS — Z96642 Presence of left artificial hip joint: Secondary | ICD-10-CM

## 2023-02-04 HISTORY — PX: TOTAL HIP ARTHROPLASTY: SHX124

## 2023-02-04 LAB — BASIC METABOLIC PANEL
Anion gap: 10 (ref 5–15)
BUN: 13 mg/dL (ref 6–20)
CO2: 28 mmol/L (ref 22–32)
Calcium: 8.8 mg/dL — ABNORMAL LOW (ref 8.9–10.3)
Chloride: 100 mmol/L (ref 98–111)
Creatinine, Ser: 0.64 mg/dL (ref 0.61–1.24)
GFR, Estimated: >60 mL/min (ref >60)
Glucose, Bld: 111 mg/dL — ABNORMAL HIGH (ref 70–99)
Potassium: 3.9 mmol/L (ref 3.5–5.1)
Sodium: 138 mmol/L (ref 135–145)

## 2023-02-04 LAB — CBC
HCT: 40 % (ref 39.0–52.0)
Hemoglobin: 13.5 g/dL (ref 13.0–17.0)
MCH: 33.8 pg (ref 26.0–34.0)
MCHC: 33.8 g/dL (ref 30.0–36.0)
MCV: 100 fL (ref 80.0–100.0)
Platelets: 133 10*3/uL — ABNORMAL LOW (ref 150–400)
RBC: 4 MIL/uL — ABNORMAL LOW (ref 4.22–5.81)
RDW: 12.8 % (ref 11.5–15.5)
WBC: 9.6 10*3/uL (ref 4.0–10.5)
nRBC: 0 % (ref 0.0–0.2)

## 2023-02-04 LAB — SURGICAL PCR SCREEN
MRSA, PCR: NEGATIVE
Staphylococcus aureus: NEGATIVE

## 2023-02-04 LAB — VITAMIN D 25 HYDROXY (VIT D DEFICIENCY, FRACTURES): Vit D, 25-Hydroxy: 42.48 ng/mL (ref 30–100)

## 2023-02-04 LAB — HIV ANTIBODY (ROUTINE TESTING W REFLEX): HIV Screen 4th Generation wRfx: NONREACTIVE

## 2023-02-04 SURGERY — ARTHROPLASTY, HIP, TOTAL, ANTERIOR APPROACH
Anesthesia: Spinal | Site: Hip | Laterality: Left

## 2023-02-04 MED ORDER — 0.9 % SODIUM CHLORIDE (POUR BTL) OPTIME
TOPICAL | Status: DC | PRN
  Administered 2023-02-04: 1000 mL

## 2023-02-04 MED ORDER — CEFAZOLIN SODIUM-DEXTROSE 2-4 GM/100ML-% IV SOLN
2.0000 g | INTRAVENOUS | Status: AC
  Administered 2023-02-04: 2 g via INTRAVENOUS

## 2023-02-04 MED ORDER — DIPHENHYDRAMINE HCL 12.5 MG/5ML PO ELIX: 12.5000 mg | ORAL_SOLUTION | ORAL | Status: DC | PRN

## 2023-02-04 MED ORDER — CEFAZOLIN SODIUM-DEXTROSE 2-4 GM/100ML-% IV SOLN
INTRAVENOUS | Status: AC
  Filled 2023-02-04: qty 100

## 2023-02-04 MED ORDER — ALUM & MAG HYDROXIDE-SIMETH 200-200-20 MG/5ML PO SUSP: 30.0000 mL | ORAL | Status: DC | PRN

## 2023-02-04 MED ORDER — DROPERIDOL 2.5 MG/ML IJ SOLN: 0.6250 mg | Freq: Once | INTRAMUSCULAR | Status: DC | PRN

## 2023-02-04 MED ORDER — TRANEXAMIC ACID-NACL 1000-0.7 MG/100ML-% IV SOLN
INTRAVENOUS | Status: AC
  Filled 2023-02-04: qty 100

## 2023-02-04 MED ORDER — METOCLOPRAMIDE HCL 5 MG/ML IJ SOLN: 5.0000 mg | Freq: Three times a day (TID) | INTRAMUSCULAR | Status: DC | PRN

## 2023-02-04 MED ORDER — SODIUM CHLORIDE (PF) 0.9 % IJ SOLN
INTRAMUSCULAR | Status: AC
  Filled 2023-02-04: qty 50

## 2023-02-04 MED ORDER — CHLORHEXIDINE GLUCONATE 4 % EX SOLN: 60.0000 mL | Freq: Once | CUTANEOUS | Status: DC

## 2023-02-04 MED ORDER — PHENYLEPHRINE HCL (PRESSORS) 10 MG/ML IV SOLN
INTRAVENOUS | Status: AC
  Filled 2023-02-04: qty 1

## 2023-02-04 MED ORDER — SENNA 8.6 MG PO TABS
2.0000 | ORAL_TABLET | Freq: Every day | ORAL | Status: DC
  Administered 2023-02-04: 17.2 mg via ORAL
  Filled 2023-02-04: qty 2

## 2023-02-04 MED ORDER — ONDANSETRON HCL 4 MG/2ML IJ SOLN: 4.0000 mg | Freq: Once | INTRAMUSCULAR | Status: DC | PRN

## 2023-02-04 MED ORDER — PHENOL 1.4 % MT LIQD: 1.0000 | OROMUCOSAL | Status: DC | PRN

## 2023-02-04 MED ORDER — SODIUM CHLORIDE (PF) 0.9 % IJ SOLN
INTRAMUSCULAR | Status: DC | PRN
  Administered 2023-02-04: 61 mL

## 2023-02-04 MED ORDER — BISACODYL 10 MG RE SUPP: 10.0000 mg | Freq: Every day | RECTAL | Status: DC | PRN

## 2023-02-04 MED ORDER — BUPIVACAINE HCL (PF) 0.5 % IJ SOLN
INTRAMUSCULAR | Status: DC | PRN
Start: 2023-02-04 — End: 2023-02-04
  Administered 2023-02-04: 13 mg via INTRATHECAL

## 2023-02-04 MED ORDER — DEXAMETHASONE SODIUM PHOSPHATE 10 MG/ML IJ SOLN
8.0000 mg | Freq: Once | INTRAMUSCULAR | Status: AC
  Administered 2023-02-04: 8 mg via INTRAVENOUS

## 2023-02-04 MED ORDER — LACTATED RINGERS IV SOLN: INTRAVENOUS | Status: DC

## 2023-02-04 MED ORDER — MENTHOL 3 MG MT LOZG: 1.0000 | LOZENGE | OROMUCOSAL | Status: DC | PRN

## 2023-02-04 MED ORDER — OXYCODONE HCL 5 MG PO TABS
10.0000 mg | ORAL_TABLET | ORAL | Status: DC | PRN
  Administered 2023-02-04 – 2023-02-05 (×4): 10 mg via ORAL
  Filled 2023-02-04 (×3): qty 2

## 2023-02-04 MED ORDER — METHOCARBAMOL 1000 MG/10ML IJ SOLN: 500.0000 mg | Freq: Four times a day (QID) | INTRAVENOUS | Status: DC | PRN

## 2023-02-04 MED ORDER — BUPIVACAINE-EPINEPHRINE 0.25% -1:200000 IJ SOLN
INTRAMUSCULAR | Status: AC
  Filled 2023-02-04: qty 1

## 2023-02-04 MED ORDER — METHOCARBAMOL 500 MG PO TABS
500.0000 mg | ORAL_TABLET | Freq: Four times a day (QID) | ORAL | Status: DC | PRN
  Administered 2023-02-04 – 2023-02-05 (×3): 500 mg via ORAL
  Filled 2023-02-04 (×3): qty 1

## 2023-02-04 MED ORDER — OXYCODONE HCL 5 MG/5ML PO SOLN: 5.0000 mg | Freq: Once | ORAL | Status: DC | PRN

## 2023-02-04 MED ORDER — MIDAZOLAM HCL 2 MG/2ML IJ SOLN
INTRAMUSCULAR | Status: AC
  Filled 2023-02-04: qty 2

## 2023-02-04 MED ORDER — KETOROLAC TROMETHAMINE 30 MG/ML IJ SOLN
INTRAMUSCULAR | Status: AC
  Filled 2023-02-04: qty 1

## 2023-02-04 MED ORDER — ACETAMINOPHEN 325 MG PO TABS
650.0000 mg | ORAL_TABLET | Freq: Four times a day (QID) | ORAL | Status: AC
  Administered 2023-02-04 – 2023-02-05 (×4): 650 mg via ORAL
  Filled 2023-02-04 (×4): qty 2

## 2023-02-04 MED ORDER — LACTATED RINGERS IV SOLN: INTRAVENOUS | Status: DC | PRN

## 2023-02-04 MED ORDER — FENTANYL CITRATE (PF) 100 MCG/2ML IJ SOLN
INTRAMUSCULAR | Status: DC | PRN
  Administered 2023-02-04: 50 ug via INTRAVENOUS

## 2023-02-04 MED ORDER — ASPIRIN 81 MG PO CHEW
81.0000 mg | CHEWABLE_TABLET | Freq: Two times a day (BID) | ORAL | Status: DC
  Administered 2023-02-04 – 2023-02-05 (×2): 81 mg via ORAL
  Filled 2023-02-04 (×2): qty 1

## 2023-02-04 MED ORDER — TRANEXAMIC ACID-NACL 1000-0.7 MG/100ML-% IV SOLN
1000.0000 mg | INTRAVENOUS | Status: AC
  Administered 2023-02-04: 1000 mg via INTRAVENOUS

## 2023-02-04 MED ORDER — FENTANYL CITRATE (PF) 100 MCG/2ML IJ SOLN
INTRAMUSCULAR | Status: AC
  Filled 2023-02-04: qty 2

## 2023-02-04 MED ORDER — PROPOFOL 500 MG/50ML IV EMUL
INTRAVENOUS | Status: DC | PRN
Start: 2023-02-04 — End: 2023-02-04
  Administered 2023-02-04: 50 ug/kg/min via INTRAVENOUS

## 2023-02-04 MED ORDER — ONDANSETRON HCL 4 MG/2ML IJ SOLN
INTRAMUSCULAR | Status: DC | PRN
  Administered 2023-02-04: 4 mg via INTRAVENOUS

## 2023-02-04 MED ORDER — OXYCODONE HCL 5 MG PO TABS
5.0000 mg | ORAL_TABLET | ORAL | Status: DC | PRN
  Filled 2023-02-04: qty 2

## 2023-02-04 MED ORDER — DEXAMETHASONE SODIUM PHOSPHATE 10 MG/ML IJ SOLN
10.0000 mg | Freq: Once | INTRAMUSCULAR | Status: AC
  Administered 2023-02-05: 10 mg via INTRAVENOUS
  Filled 2023-02-04: qty 1

## 2023-02-04 MED ORDER — CEFAZOLIN SODIUM-DEXTROSE 2-4 GM/100ML-% IV SOLN
2.0000 g | Freq: Four times a day (QID) | INTRAVENOUS | Status: AC
  Administered 2023-02-04 (×2): 2 g via INTRAVENOUS
  Filled 2023-02-04 (×2): qty 100

## 2023-02-04 MED ORDER — PROPOFOL 10 MG/ML IV BOLUS
INTRAVENOUS | Status: DC | PRN
  Administered 2023-02-04 (×2): 20 mg via INTRAVENOUS

## 2023-02-04 MED ORDER — OXYCODONE HCL 5 MG PO TABS: 5.0000 mg | ORAL_TABLET | Freq: Once | ORAL | Status: DC | PRN

## 2023-02-04 MED ORDER — MIDAZOLAM HCL 5 MG/5ML IJ SOLN
INTRAMUSCULAR | Status: DC | PRN
  Administered 2023-02-04: 2 mg via INTRAVENOUS

## 2023-02-04 MED ORDER — FENTANYL CITRATE PF 50 MCG/ML IJ SOSY: 25.0000 ug | PREFILLED_SYRINGE | INTRAMUSCULAR | Status: DC | PRN

## 2023-02-04 MED ORDER — STERILE WATER FOR IRRIGATION IR SOLN
Status: DC | PRN
  Administered 2023-02-04: 1000 mL

## 2023-02-04 MED ORDER — POVIDONE-IODINE 10 % EX SWAB: 2.0000 | Freq: Once | CUTANEOUS | Status: DC

## 2023-02-04 MED ORDER — ONDANSETRON HCL 4 MG/2ML IJ SOLN: 4.0000 mg | Freq: Four times a day (QID) | INTRAMUSCULAR | Status: DC | PRN

## 2023-02-04 MED ORDER — ONDANSETRON HCL 4 MG PO TABS: 4.0000 mg | ORAL_TABLET | Freq: Four times a day (QID) | ORAL | Status: DC | PRN

## 2023-02-04 MED ORDER — METOCLOPRAMIDE HCL 5 MG PO TABS: 5.0000 mg | ORAL_TABLET | Freq: Three times a day (TID) | ORAL | Status: DC | PRN

## 2023-02-04 MED ORDER — ACETAMINOPHEN 10 MG/ML IV SOLN: 1000.0000 mg | Freq: Once | INTRAVENOUS | Status: DC | PRN

## 2023-02-04 MED ORDER — PHENYLEPHRINE HCL-NACL 20-0.9 MG/250ML-% IV SOLN
INTRAVENOUS | Status: DC | PRN
Start: 2023-02-04 — End: 2023-02-04
  Administered 2023-02-04: 25 ug/min via INTRAVENOUS

## 2023-02-04 MED ORDER — HYDROMORPHONE HCL 1 MG/ML IJ SOLN
0.5000 mg | INTRAMUSCULAR | Status: DC | PRN
  Administered 2023-02-04 – 2023-02-05 (×2): 1 mg via INTRAVENOUS
  Filled 2023-02-04 (×2): qty 1

## 2023-02-04 MED ORDER — POLYETHYLENE GLYCOL 3350 17 G PO PACK
17.0000 g | PACK | Freq: Two times a day (BID) | ORAL | Status: DC
  Administered 2023-02-04 – 2023-02-05 (×2): 17 g via ORAL
  Filled 2023-02-04 (×2): qty 1

## 2023-02-04 MED ORDER — TRANEXAMIC ACID-NACL 1000-0.7 MG/100ML-% IV SOLN
1000.0000 mg | Freq: Once | INTRAVENOUS | Status: AC
  Administered 2023-02-04: 1000 mg via INTRAVENOUS
  Filled 2023-02-04: qty 100

## 2023-02-04 SURGICAL SUPPLY — 42 items
ADH SKN CLS APL DERMABOND .7 (GAUZE/BANDAGES/DRESSINGS) ×1
BAG COUNTER SPONGE SURGICOUNT (BAG) IMPLANT
BAG SPEC THK2 15X12 ZIP CLS (MISCELLANEOUS)
BAG SPNG CNTER NS LX DISP (BAG)
BAG ZIPLOCK 12X15 (MISCELLANEOUS) IMPLANT
BLADE SAG 18X100X1.27 (BLADE) ×1 IMPLANT
COVER PERINEAL POST (MISCELLANEOUS) ×1 IMPLANT
COVER SURGICAL LIGHT HANDLE (MISCELLANEOUS) ×1 IMPLANT
CUP ACETBLR 52 OD PINNACLE (Hips) IMPLANT
DERMABOND ADVANCED .7 DNX12 (GAUZE/BANDAGES/DRESSINGS) ×1 IMPLANT
DRAPE FOOT SWITCH (DRAPES) ×1 IMPLANT
DRAPE STERI IOBAN 125X83 (DRAPES) ×1 IMPLANT
DRAPE U-SHAPE 47X51 STRL (DRAPES) ×2 IMPLANT
DRESSING AQUACEL AG SP 3.5X10 (GAUZE/BANDAGES/DRESSINGS) ×1 IMPLANT
DRSG AQUACEL AG ADV 3.5X10 (GAUZE/BANDAGES/DRESSINGS) IMPLANT
DRSG AQUACEL AG SP 3.5X10 (GAUZE/BANDAGES/DRESSINGS) ×1
DURAPREP 26ML APPLICATOR (WOUND CARE) ×1 IMPLANT
ELECT REM PT RETURN 15FT ADLT (MISCELLANEOUS) ×1 IMPLANT
GLOVE BIO SURGEON STRL SZ 6 (GLOVE) ×1 IMPLANT
GLOVE BIOGEL PI IND STRL 6.5 (GLOVE) ×1 IMPLANT
GLOVE BIOGEL PI IND STRL 7.5 (GLOVE) ×1 IMPLANT
GLOVE ORTHO TXT STRL SZ7.5 (GLOVE) ×2 IMPLANT
GOWN STRL REUS W/ TWL LRG LVL3 (GOWN DISPOSABLE) ×2 IMPLANT
GOWN STRL REUS W/TWL LRG LVL3 (GOWN DISPOSABLE) ×2
HEAD CERAMIC 36 PLUS5 (Hips) IMPLANT
HOLDER FOLEY CATH W/STRAP (MISCELLANEOUS) ×1 IMPLANT
KIT TURNOVER KIT A (KITS) IMPLANT
LINER NEUTRAL 52X36MM PLUS 4 (Liner) IMPLANT
NDL SAFETY ECLIPSE 18X1.5 (NEEDLE) IMPLANT
PACK ANTERIOR HIP CUSTOM (KITS) ×1 IMPLANT
SCREW 6.5MMX30MM (Screw) IMPLANT
STEM FEMORAL SZ5 HIGH ACTIS (Stem) IMPLANT
SUT MNCRL AB 4-0 PS2 18 (SUTURE) ×1 IMPLANT
SUT STRATAFIX 0 PDS 27 VIOLET (SUTURE) ×1
SUT VIC AB 1 CT1 36 (SUTURE) ×3 IMPLANT
SUT VIC AB 2-0 CT1 27 (SUTURE) ×2
SUT VIC AB 2-0 CT1 TAPERPNT 27 (SUTURE) ×2 IMPLANT
SUTURE STRATFX 0 PDS 27 VIOLET (SUTURE) ×1 IMPLANT
SYR 3ML LL SCALE MARK (SYRINGE) IMPLANT
TRAY FOLEY MTR SLVR 16FR STAT (SET/KITS/TRAYS/PACK) IMPLANT
TUBE SUCTION HIGH CAP CLEAR NV (SUCTIONS) ×1 IMPLANT
WATER STERILE IRR 1000ML POUR (IV SOLUTION) ×1 IMPLANT

## 2023-02-04 NOTE — Anesthesia Preprocedure Evaluation (Addendum)
Anesthesia Evaluation  Patient identified by MRN, date of birth, ID band Patient awake    Reviewed: Allergy & Precautions, NPO status , Patient's Chart, lab work & pertinent test results  Airway Mallampati: II  TM Distance: >3 FB Neck ROM: Full    Dental no notable dental hx. (+) Teeth Intact, Dental Advisory Given   Pulmonary COPD,  COPD inhaler, Current Smoker and Patient abstained from smoking.   Pulmonary exam normal breath sounds clear to auscultation       Cardiovascular + Peripheral Vascular Disease  Normal cardiovascular exam Rhythm:Regular Rate:Normal     Neuro/Psych  PSYCHIATRIC DISORDERS Anxiety        GI/Hepatic ,,,(+)     substance abuse    Endo/Other    Renal/GU Lab Results      Component                Value               Date                          K                        3.6                 02/03/2023                   CREATININE               0.82                02/03/2023           GLUCOSE                  101 (H)             02/03/2023                Musculoskeletal   Abdominal   Peds  Hematology Lab Results      Component                Value               Date                      WBC                      12.1 (H)            02/03/2023                HGB                      15.1                02/03/2023                HCT                      44.9                02/03/2023               PLT                      155  02/03/2023              Anesthesia Other Findings All: NSAIDS  Reproductive/Obstetrics                             Anesthesia Physical Anesthesia Plan  ASA: 3  Anesthesia Plan: Spinal   Post-op Pain Management:    Induction:   PONV Risk Score and Plan: Propofol infusion and Treatment may vary due to age or medical condition  Airway Management Planned: Natural Airway and Nasal Cannula  Additional Equipment:  None  Intra-op Plan:   Post-operative Plan:   Informed Consent: I have reviewed the patients History and Physical, chart, labs and discussed the procedure including the risks, benefits and alternatives for the proposed anesthesia with the patient or authorized representative who has indicated his/her understanding and acceptance.     Dental advisory given  Plan Discussed with: CRNA and Anesthesiologist  Anesthesia Plan Comments: (Spinal )        Anesthesia Quick Evaluation

## 2023-02-04 NOTE — Interval H&P Note (Signed)
History and Physical Interval Note:  02/04/2023 10:59 AM  Walter Alvarado  has presented today for surgery, with the diagnosis of LEFT HIP FRACTURE.  The various methods of treatment have been discussed with the patient and family. After consideration of risks, benefits and other options for treatment, the patient has consented to  Procedure(s): TOTAL HIP ARTHROPLASTY ANTERIOR APPROACH (Left) as a surgical intervention.  The patient's history has been reviewed, patient examined, no change in status, stable for surgery.  I have reviewed the patient's chart and labs.  Questions were answered to the patient's satisfaction.     Shelda Pal

## 2023-02-04 NOTE — Progress Notes (Signed)
Orthopedic Tech Progress Note Patient Details:  Walter Alvarado 1970-05-22 [id number]  Patient ID: Walter Alvarado, male   DOB: 07/08/1970, 52 y.o.   MRN: [id number] OHF installed [name] 02/04/2023, 2:50 PM

## 2023-02-04 NOTE — Anesthesia Procedure Notes (Signed)
Spinal  Patient location during procedure: OR Start time: 02/04/2023 11:27 AM End time: 02/04/2023 11:31 AM Reason for block: surgical anesthesia Staffing Performed: anesthesiologist  Anesthesiologist: Trevor Iha, MD Performed by: Trevor Iha, MD Authorized by: Trevor Iha, MD   Preanesthetic Checklist Completed: patient identified, IV checked, risks and benefits discussed, surgical consent, monitors and equipment checked, pre-op evaluation and timeout performed Spinal Block Patient position: right lateral decubitus Prep: DuraPrep and site prepped and draped Patient monitoring: heart rate, cardiac monitor, continuous pulse ox and blood pressure Approach: midline Location: L3-4 Injection technique: single-shot Needle Needle type: Pencan  Needle gauge: 24 G Needle length: 10 cm Needle insertion depth: 6 cm Assessment Sensory level: T4 Events: CSF return Additional Notes  1 Attempt (s). Pt tolerated procedure well.

## 2023-02-04 NOTE — Transfer of Care (Signed)
Immediate Anesthesia Transfer of Care Note  Patient: Walter Alvarado  Procedure(s) Performed: TOTAL HIP ARTHROPLASTY ANTERIOR APPROACH (Left: Hip)  Patient Location: PACU  Anesthesia Type:General  Level of Consciousness: awake and alert   Airway & Oxygen Therapy: Patient Spontanous Breathing and Patient connected to face mask oxygen  Post-op Assessment: Report given to RN and Post -op Vital signs reviewed and stable  Post vital signs: Reviewed and stable  Last Vitals:  Vitals Value Taken Time  BP    Temp    Pulse    Resp    SpO2      Last Pain:  Vitals:   02/04/23 1037  TempSrc: Oral  PainSc: 5       Patients Stated Pain Goal: 5 (02/04/23 1037)  Complications: No notable events documented.

## 2023-02-04 NOTE — Progress Notes (Signed)
Progress Note    Walter Alvarado   Walter Alvarado:[id number]  DOB: February 15, 1971  DOA: 02/03/2023     1 PCP: [facility]  Initial CC: fall  Hospital Course: Walter Alvarado is a 52 y.o. male with medical history significant for COPD, anxiety, osteoporosis, tobacco use who is admitted with a left hip fracture after mechanical fall.  Interval History:  Resting when seen. Understands plan for surgery today with ortho. Pain as expected.  We also discussed tobacco cessation some.   Assessment and Plan: * Closed fracture of neck of left femur (HCC) - Occurring after mechanical fall.   -Orthopedic surgery consulted on admission.  Plan is for operative repair today - Per x-ray on admission "nondisplaced left femoral neck fracture" - Continue pain control - PT/OT evals after surgery  COPD (chronic obstructive pulmonary disease) (HCC) - Stable without wheezing on admission.  Saturating well on room air.  Continue Breztri and albuterol as needed. - no sxms of exac  Anxiety Continue chronic home Xanax 1 mg 4 times daily as needed (verified on database) - Continue home trazodone for sleep.  Tobacco use Patient reports smoking 2 PPD.  Smoking cessation recommended.  Nicotine patch provided.   Old records reviewed in assessment of this patient  Antimicrobials:   DVT prophylaxis:  SCDs Start: 02/03/23 1925   Code Status:   Code Status: Full Code  Mobility Assessment (Last 72 Hours)     Mobility Assessment     Row Name 02/04/23 0730 02/04/23 0300 02/04/23 0222 02/03/23 2341     Does patient have an order for bedrest or is patient medically unstable No - Continue assessment No - Continue assessment -- No - Continue assessment    What is the highest level of mobility based on the progressive mobility assessment? Level 1 (Bedfast) - Unable to balance while sitting on edge of bed Level 1 (Bedfast) - Unable to balance while sitting on edge of bed  fracture -- Level 1 (Bedfast) -  Unable to balance while sitting on edge of bed  fracture    Is the above level different from baseline mobility prior to current illness? Yes - Recommend PT order -- Yes - Recommend PT order --             Barriers to discharge: None Disposition Plan:  Home Status is: Inpt  Objective: Blood pressure 137/84, pulse 81, temperature 98.3 F (36.8 C), temperature source Oral, resp. rate 18, height 5\' 10"  (1.778 m), weight 57.2 kg, SpO2 94%.  Examination:  Physical Exam Constitutional:      General: He is not in acute distress.    Appearance: Normal appearance.  HENT:     Head: Normocephalic and atraumatic.     Mouth/Throat:     Mouth: Mucous membranes are moist.  Eyes:     Extraocular Movements: Extraocular movements intact.  Cardiovascular:     Rate and Rhythm: Normal rate and regular rhythm.  Pulmonary:     Effort: Pulmonary effort is normal. No respiratory distress.     Breath sounds: No wheezing.     Comments: Coarse breath sounds diffusely  Abdominal:     General: Bowel sounds are normal. There is no distension.     Palpations: Abdomen is soft.     Tenderness: There is no abdominal tenderness.  Musculoskeletal:     Cervical back: Normal range of motion and neck supple.     Comments: LLE exam deferred in setting of fracture  Skin:  General: Skin is warm and dry.  Neurological:     General: No focal deficit present.     Mental Status: He is alert.  Psychiatric:        Mood and Affect: Mood normal.        Behavior: Behavior normal.      Consultants:  Orthopedic surgery  Procedures:    Data Reviewed: Results for orders placed or performed during the hospital encounter of 02/03/23 (from the past 24 hour(s))  Type and screen     Status: None   Collection Time: 02/03/23  5:42 PM  Result Value Ref Range   ABO/RH(D) A POS    Antibody Screen NEG    Sample Expiration      02/06/2023,2359 Performed at [facility], [address],  [place], [place] [address]   CBC with Differential     Status: Abnormal   Collection Time: 02/03/23  5:44 PM  Result Value Ref Range   WBC 12.1 (H) 4.0 - 10.5 K/uL   RBC 4.44 4.22 - 5.81 MIL/uL   Hemoglobin 15.1 13.0 - 17.0 g/dL   HCT 57.8 46.9 - 62.9 %   MCV 101.1 (H) 80.0 - 100.0 fL   MCH 34.0 26.0 - 34.0 pg   MCHC 33.6 30.0 - 36.0 g/dL   RDW 52.8 41.3 - 24.4 %   Platelets 155 150 - 400 K/uL   nRBC 0.0 0.0 - 0.2 %   Neutrophils Relative % 72 %   Neutro Abs 8.9 (H) 1.7 - 7.7 K/uL   Lymphocytes Relative 19 %   Lymphs Abs 2.3 0.7 - 4.0 K/uL   Monocytes Relative 7 %   Monocytes Absolute 0.8 0.1 - 1.0 K/uL   Eosinophils Relative 1 %   Eosinophils Absolute 0.1 0.0 - 0.5 K/uL   Basophils Relative 0 %   Basophils Absolute 0.0 0.0 - 0.1 K/uL   Immature Granulocytes 1 %   Abs Immature Granulocytes 0.07 0.00 - 0.07 K/uL  Basic metabolic panel     Status: Abnormal   Collection Time: 02/03/23  5:44 PM  Result Value Ref Range   Sodium 137 135 - 145 mmol/L   Potassium 3.6 3.5 - 5.1 mmol/L   Chloride 102 98 - 111 mmol/L   CO2 26 22 - 32 mmol/L   Glucose, Bld 101 (H) 70 - 99 mg/dL   BUN 13 6 - 20 mg/dL   Creatinine, Ser 0.10 0.61 - 1.24 mg/dL   Calcium 8.8 (L) 8.9 - 10.3 mg/dL   GFR, Estimated >27 >25 mL/min   Anion gap 9 5 - 15  Protime-INR     Status: None   Collection Time: 02/03/23  5:44 PM  Result Value Ref Range   Prothrombin Time 13.0 11.4 - 15.2 seconds   INR 1.0 0.8 - 1.2  Surgical pcr screen     Status: None   Collection Time: 02/04/23  1:02 AM   Specimen: Nasal Mucosa; Nasal Swab  Result Value Ref Range   MRSA, PCR NEGATIVE NEGATIVE   Staphylococcus aureus NEGATIVE NEGATIVE  VITAMIN D 25 Hydroxy (Vit-D Deficiency, Fractures)     Status: None   Collection Time: 02/04/23  5:27 AM  Result Value Ref Range   Vit D, 25-Hydroxy 42.48 30 - 100 ng/mL  CBC     Status: Abnormal   Collection Time: 02/04/23  5:27 AM  Result Value Ref Range   WBC 9.6 4.0 - 10.5 K/uL   RBC 4.00 (L)  4.22 -  5.81 MIL/uL   Hemoglobin 13.5 13.0 - 17.0 g/dL   HCT 16.1 09.6 - 04.5 %   MCV 100.0 80.0 - 100.0 fL   MCH 33.8 26.0 - 34.0 pg   MCHC 33.8 30.0 - 36.0 g/dL   RDW 40.9 81.1 - 91.4 %   Platelets 133 (L) 150 - 400 K/uL   nRBC 0.0 0.0 - 0.2 %  Basic metabolic panel     Status: Abnormal   Collection Time: 02/04/23  5:27 AM  Result Value Ref Range   Sodium 138 135 - 145 mmol/L   Potassium 3.9 3.5 - 5.1 mmol/L   Chloride 100 98 - 111 mmol/L   CO2 28 22 - 32 mmol/L   Glucose, Bld 111 (H) 70 - 99 mg/dL   BUN 13 6 - 20 mg/dL   Creatinine, Ser 7.82 0.61 - 1.24 mg/dL   Calcium 8.8 (L) 8.9 - 10.3 mg/dL   GFR, Estimated >95 >62 mL/min   Anion gap 10 5 - 15    I have reviewed pertinent nursing notes, vitals, labs, and images as necessary. I have ordered labwork to follow up on as indicated.  I have reviewed the last notes from staff over past 24 hours. I have discussed patient's care plan and test results with nursing staff, CM/SW, and other staff as appropriate.  Time spent: Greater than 50% of the 55 minute visit was spent in counseling/coordination of care for the patient as laid out in the A&P.   LOS: 1 day   Lewie Chamber, MD [facility] 02/04/2023, 11:31 AM

## 2023-02-04 NOTE — Plan of Care (Signed)
  Problem: Education: Goal: Knowledge of General Education information will improve Description: Including pain rating scale, medication(s)/side effects and non-pharmacologic comfort measures Outcome: Progressing   Problem: Health Behavior/Discharge Planning: Goal: Ability to manage health-related needs will improve Outcome: Progressing   Problem: Clinical Measurements: Goal: Ability to maintain clinical measurements within normal limits will improve Outcome: Progressing Goal: Will remain free from infection Outcome: Progressing Goal: Diagnostic test results will improve Outcome: Progressing Goal: Cardiovascular complication will be avoided Outcome: Progressing   Problem: Nutrition: Goal: Adequate nutrition will be maintained Outcome: Progressing   Problem: Coping: Goal: Level of anxiety will decrease Outcome: Progressing   Problem: Elimination: Goal: Will not experience complications related to bowel motility Outcome: Progressing   Problem: Pain Managment: Goal: General experience of comfort will improve Outcome: Progressing   Problem: Safety: Goal: Ability to remain free from injury will improve Outcome: Progressing   Problem: Skin Integrity: Goal: Risk for impaired skin integrity will decrease Outcome: Progressing   Problem: Education: Goal: Verbalization of understanding the information provided (i.e., activity precautions, restrictions, etc) will improve Outcome: Progressing   Problem: Education: Goal: Knowledge of the prescribed therapeutic regimen will improve Outcome: Progressing Goal: Understanding of discharge needs will improve Outcome: Progressing

## 2023-02-04 NOTE — Anesthesia Postprocedure Evaluation (Signed)
Anesthesia Post Note  Patient: PRICE LACHAPELLE  Procedure(s) Performed: TOTAL HIP ARTHROPLASTY ANTERIOR APPROACH (Left: Hip)     Patient location during evaluation: Nursing Unit Anesthesia Type: Spinal Level of consciousness: oriented and awake and alert Pain management: pain level controlled Vital Signs Assessment: post-procedure vital signs reviewed and stable Respiratory status: spontaneous breathing and respiratory function stable Cardiovascular status: blood pressure returned to baseline and stable Postop Assessment: no headache, no backache, no apparent nausea or vomiting and patient able to bend at knees Anesthetic complications: no   No notable events documented.  Last Vitals:  Vitals:   02/04/23 1330 02/04/23 1345  BP: 127/75 (!) 137/101  Pulse: 69 90  Resp: 11 19  Temp:    SpO2: 100% 100%    Last Pain:  Vitals:   02/04/23 1345  TempSrc:   PainSc: 0-No pain                 Trevor Iha
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
## 2023-02-04 NOTE — Op Note (Signed)
NAME:  Walter Alvarado                ACCOUNT NO.: [id number]      MEDICAL RECORD NO.: [id number]      FACILITY:  [facility]      PHYSICIAN:  Shelda Pal  DATE OF BIRTH:  1970-06-25     DATE OF PROCEDURE:  02/04/2023                                 OPERATIVE REPORT         PREOPERATIVE DIAGNOSIS: Left  hip femoral neck fracture     POSTOPERATIVE DIAGNOSIS:  Left hip femoral neck fracture      PROCEDURE:  Left total hip replacement through an anterior approach   utilizing DePuy THR system, component size 52 mm pinnacle cup, a size 36+4 neutral   Altrex liner, a size 5 Hi Actis stem with a 36+5 delta ceramic   ball.      SURGEON:  Madlyn Frankel. Charlann Boxer, M.D.      ASSISTANT:  Rosalene Billings, PA-C     ANESTHESIA:  Spinal.      SPECIMENS:  None.      COMPLICATIONS:  None.      BLOOD LOSS:  100 cc     DRAINS:  None.      INDICATION OF THE PROCEDURE:  Walter Alvarado is a 52 y.o. male who sustained a ground level fall on to his left hip.  He had immediate onset of pain and was brought to the ER where X-rays revealed a left hip femoral neck fracture.  Options were presented with recommendations for left total hip replacement.  Risks of infection, DVT, dislocation, neurovascular injury and the need for future surgery as well as the post operative course and expectations reviewed.  Consent was obtained for management of his hip fracture and pain.     PROCEDURE IN DETAIL:  The patient was brought to operative theater.   Once adequate anesthesia, preoperative antibiotics, 2 gm of Ancef, 1 gm of Tranexamic Acid, and 10 mg of Decadron were administered, the patient was positioned supine on the [employer] table.  Once the patient was safely positioned with adequate padding of boney prominences we predraped out the hip, and used fluoroscopy to confirm orientation of the pelvis.      The left hip was then prepped and draped from proximal iliac crest to   mid thigh with a shower  curtain technique.      Time-out was performed identifying the patient, planned procedure, and the appropriate extremity.     An incision was then made 2 cm lateral to the   anterior superior iliac spine extending over the orientation of the   tensor fascia lata muscle and sharp dissection was carried down to the   fascia of the muscle.      The fascia was then incised.  The muscle belly was identified and swept   laterally and retractor placed along the superior neck.  Following   cauterization of the circumflex vessels and removing some pericapsular   fat, a second cobra retractor was placed on the inferior neck.  A T-capsulotomy was made along the line of the   superior neck to the trochanteric fossa, then extended proximally and   distally.  Tag sutures were placed and the retractors were then placed   intracapsular.  We  then identified the trochanteric fossa and   orientation of my neck cut and then made a neck osteotomy with the femur on traction.  The femoral   head was removed without difficulty or complication.  Traction was let   off and retractors were placed posterior and anterior around the   acetabulum.      The labrum and foveal tissue were debrided.  I began reaming with a 48 mm   reamer and reamed up to 51 mm reamer with good bony bed preparation and a 52 mm  cup was chosen.  The final 52 mm Pinnacle cup was then impacted under fluoroscopy to confirm the depth of penetration and orientation with respect to   Abduction and forward flexion.  A screw was placed into the ilium followed by the hole eliminator.  The final   36+4 neutral Altrex liner was impacted with good visualized rim fit.  The cup was positioned anatomically within the acetabular portion of the pelvis.      At this point, the femur was rolled to 100 degrees.  Further capsule was   released off the inferior aspect of the femoral neck.  I then   released the superior capsule proximally.  With the leg in a  neutral position the hook was placed laterally   along the femur under the vastus lateralis origin and elevated manually and then held in position using the hook attachment on the bed.  The leg was then extended and adducted with the leg rolled to 100   degrees of external rotation.  Retractors were placed along the medial calcar and posteriorly over the greater trochanter.  Once the proximal femur was fully   exposed, I used a box osteotome to set orientation.  I then began   broaching with the starting chili pepper broach and passed this by hand and then broached up to 5.  With the 5 broach in place I chose a high offset neck and did several trial reductions.  The offset was appropriate, leg lengths   appeared to be equal best matched with the +5 head ball trial confirmed radiographically.   Given these findings, I went ahead and dislocated the hip, repositioned all   retractors and positioned the right hip in the extended and abducted position.  The final 5 Hi Actis stem was   chosen and it was impacted down to the level of neck cut.  Based on this   and the trial reductions, a final 36+5 delta ceramic ball was chosen and   impacted onto a clean and dry trunnion, and the hip was reduced.  The   hip had been irrigated throughout the case again at this point.  I did   reapproximate the superior capsular leaflet to the anterior leaflet   using #1 Vicryl.  The fascia of the   tensor fascia lata muscle was then reapproximated using #1 Vicryl and #0 Stratafix sutures.  The   remaining wound was closed with 2-0 Vicryl and running 4-0 Monocryl.   The hip was cleaned, dried, and dressed sterilely using Dermabond and   Aquacel dressing.  The patient was then brought   to recovery room in stable condition tolerating the procedure well.    Rosalene Billings, PA-C was present for the entirety of the case involved from   preoperative positioning, perioperative retractor management, general   facilitation of  the case, as well as primary wound closure as assistant.  Madlyn Frankel Charlann Boxer, M.D.        02/04/2023 11:35 AM

## 2023-02-04 NOTE — Progress Notes (Signed)
Patient ID: Walter Alvarado, male   DOB: 27-Jul-1970, 52 y.o.   MRN: [id number]  Walter Alvarado was seen and evaluated for management of his left hip femoral neck fracture.  His original consult was performed by Dr. Shelle Iron. I agree with his assessment as well as with Rosalene Billings, PA-C that treatment for his left hip fracture would be best managed with a left total hip replacement. Procedural risks were reviewed, benefits and necessity of managing his left hip fracture reviewed. Consent was obtained for management of his left hip fracture.

## 2023-02-04 NOTE — Discharge Instructions (Signed)
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
## 2023-02-05 ENCOUNTER — Encounter (HOSPITAL_COMMUNITY): Payer: Self-pay | Admitting: Orthopedic Surgery

## 2023-02-05 DIAGNOSIS — S72002A Fracture of unspecified part of neck of left femur, initial encounter for closed fracture: Secondary | ICD-10-CM | POA: Diagnosis not present

## 2023-02-05 LAB — CBC
HCT: 38.7 % — ABNORMAL LOW (ref 39.0–52.0)
Hemoglobin: 12.8 g/dL — ABNORMAL LOW (ref 13.0–17.0)
MCH: 33.7 pg (ref 26.0–34.0)
MCHC: 33.1 g/dL (ref 30.0–36.0)
MCV: 101.8 fL — ABNORMAL HIGH (ref 80.0–100.0)
Platelets: 134 10*3/uL — ABNORMAL LOW (ref 150–400)
RBC: 3.8 MIL/uL — ABNORMAL LOW (ref 4.22–5.81)
RDW: 12.7 % (ref 11.5–15.5)
WBC: 20.4 10*3/uL — ABNORMAL HIGH (ref 4.0–10.5)
nRBC: 0 % (ref 0.0–0.2)

## 2023-02-05 LAB — BASIC METABOLIC PANEL
Anion gap: 10 (ref 5–15)
BUN: 17 mg/dL (ref 6–20)
CO2: 26 mmol/L (ref 22–32)
Calcium: 8.7 mg/dL — ABNORMAL LOW (ref 8.9–10.3)
Chloride: 99 mmol/L (ref 98–111)
Creatinine, Ser: 0.67 mg/dL (ref 0.61–1.24)
GFR, Estimated: >60 mL/min (ref >60)
Glucose, Bld: 144 mg/dL — ABNORMAL HIGH (ref 70–99)
Potassium: 4 mmol/L (ref 3.5–5.1)
Sodium: 135 mmol/L (ref 135–145)

## 2023-02-05 MED ORDER — ASPIRIN 81 MG PO CHEW: 81.0000 mg | CHEWABLE_TABLET | Freq: Two times a day (BID) | ORAL | Status: AC

## 2023-02-05 MED ORDER — OXYCODONE HCL 5 MG PO TABS: 5.0000 mg | ORAL_TABLET | ORAL | 0 refills | Status: AC | PRN

## 2023-02-05 NOTE — Discharge Summary (Signed)
Physician Discharge Summary   Walter Alvarado:[id number] DOB: Jun 25, 1970 DOA: 02/03/2023  PCP: [facility]  Admit date: 02/03/2023 Discharge date: 02/05/2023   Admitted From: Home Disposition:  Home Discharging physician: Lewie Chamber, MD Barriers to discharge: none  Recommendations at discharge: Follow up with orthopedic surgery  Discharge Condition: stable CODE STATUS: Full Diet recommendation:  Diet Orders (From admission, onward)     Start     Ordered   02/05/23 0000  Diet general        02/05/23 1223   02/04/23 1500  Diet regular Room service appropriate? Yes; Fluid consistency: Thin  Diet effective now       Question Answer Comment  Room service appropriate? Yes   Fluid consistency: Thin      02/04/23 1500            Hospital Course: Walter Alvarado is a 52 y.o. male with medical history significant for COPD, anxiety, osteoporosis, tobacco use who is admitted with a left hip fracture after mechanical fall. He underwent left total hip replacement on 02/04/2023.  He ambulated well the day following surgery with physical therapy and was considered stable for discharging home.  He will follow-up outpatient with orthopedic surgery.  Assessment and Plan: * Closed fracture of neck of left femur (HCC) - Occurring after mechanical fall.   -Orthopedic surgery consulted on admission.  - Per x-ray on admission "nondisplaced left femoral neck fracture" - Underwent left total hip replacement on 02/04/2023.  Tolerated well -Ambulated well with PT day following surgery.  No DME needs or home health needed.  COPD (chronic obstructive pulmonary disease) (HCC) - Stable without wheezing on admission.  Saturating well on room air.  Continue Breztri and albuterol as needed. - no sxms of exac  Anxiety Continue chronic home Xanax 1 mg 4 times daily as needed (verified on database) - Continue home trazodone for sleep.  Tobacco use Patient reports smoking 2  PPD.  Smoking cessation recommended.  Nicotine patch provided.   The patient's acute and chronic medical conditions were treated accordingly. On day of discharge, patient was felt deemed stable for discharge. Patient/family member advised to call PCP or come back to ER if needed.   Principal Diagnosis: Closed fracture of neck of left femur [facility])  Discharge Diagnoses: Active Hospital Problems   Diagnosis Date Noted   Closed fracture of neck of left femur (HCC) 02/03/2023    Priority: 1.   COPD (chronic obstructive pulmonary disease) (HCC) 03/11/2013    Priority: 2.   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     Discharge Instructions     Diet general   Complete by: As directed    Increase activity slowly   Complete by: As directed       Allergies as of 02/05/2023   No Active Allergies      Medication List     TAKE these medications    acetaminophen 325 MG tablet Commonly known as: TYLENOL Take 650 mg by mouth every 6 (six) hours as needed for headache.   albuterol 108 (90 Base) MCG/ACT inhaler Commonly known as: VENTOLIN HFA Inhale 2 puffs into the lungs every 6 (six) hours as needed for wheezing.   ALPRAZolam 1 MG tablet Commonly known as: XANAX Take 1 tablet by mouth 4 (four) times daily as needed. Dr. Milagros Evener, MD   aspirin  81 MG chewable tablet Chew 1 tablet (81 mg total) by mouth 2 (two) times daily.   Breztri Aerosphere 160-9-4.8 MCG/ACT Aero Generic drug: Budeson-Glycopyrrol-Formoterol Inhale 1 puff into the lungs in the morning and at bedtime.   meloxicam 15 MG tablet Commonly known as: MOBIC Take 15 mg by mouth daily.   nicotine 7 mg/24hr patch Commonly known as: NICODERM CQ - dosed in mg/24 hr Place 7 mg onto the skin daily.   oxyCODONE 5 MG immediate release tablet Commonly known as: Oxy IR/ROXICODONE Take 1 tablet (5 mg total) by  mouth every 4 (four) hours as needed for moderate pain, severe pain or breakthrough pain.   traZODone 50 MG tablet Commonly known as: DESYREL Take 100 mg by mouth at bedtime.        Follow-up Information     Durene Romans, MD. Schedule an appointment as soon as possible for a visit in 2 week(s).   Specialty: Orthopedic Surgery Contact information: [address] [place] [address] [phone]                No Active Allergies  Consultations: Orthopedic surgery  Procedures: 02/04/23: Left total hip replacement  Discharge Exam: BP 124/78 (BP Location: Left Arm)   Pulse 79   Temp 97.8 F (36.6 C)   Resp 17   Ht 5\' 10"  (1.778 m)   Wt 57.2 kg   SpO2 93%   BMI 18.08 kg/m  Physical Exam Constitutional:      General: He is not in acute distress.    Appearance: Normal appearance.  HENT:     Head: Normocephalic and atraumatic.     Mouth/Throat:     Mouth: Mucous membranes are moist.  Eyes:     Extraocular Movements: Extraocular movements intact.  Cardiovascular:     Rate and Rhythm: Normal rate and regular rhythm.  Pulmonary:     Effort: Pulmonary effort is normal. No respiratory distress.     Breath sounds: No wheezing.     Comments: Coarse breath sounds diffusely  Abdominal:     General: Bowel sounds are normal. There is no distension.     Palpations: Abdomen is soft.     Tenderness: There is no abdominal tenderness.  Musculoskeletal:        General: Normal range of motion.     Cervical back: Normal range of motion and neck supple.     Comments: Surgical dressing in place on left hip.  Compartments soft.  No bruising noted  Skin:    General: Skin is warm and dry.  Neurological:     General: No focal deficit present.     Mental Status: He is alert.  Psychiatric:        Mood and Affect: Mood normal.        Behavior: Behavior normal.      The results of significant diagnostics from this hospitalization (including imaging,  microbiology, ancillary and laboratory) are listed below for reference.   Microbiology: Recent Results (from the past 240 hour(s))  Surgical pcr screen     Status: None   Collection Time: 02/04/23  1:02 AM   Specimen: Nasal Mucosa; Nasal Swab  Result Value Ref Range Status   MRSA, PCR NEGATIVE NEGATIVE Final   Staphylococcus aureus NEGATIVE NEGATIVE Final    Comment: (NOTE) The Xpert SA Assay (FDA approved for NASAL specimens in patients 71 years of age and older), is one component of a comprehensive surveillance program. It is not intended to  diagnose infection nor to guide or monitor treatment. Performed at [facility], [address], [place], [place] [address]      Labs: BNP (last 3 results) No results for input(s): "BNP" in the last 8760 hours. Basic Metabolic Panel: Recent Labs  Lab 02/03/23 1744 02/04/23 0527 02/05/23 0321  NA 137 138 135  K 3.6 3.9 4.0  CL 102 100 99  CO2 26 28 26   GLUCOSE 101* 111* 144*  BUN 13 13 17   CREATININE 0.82 0.64 0.67  CALCIUM 8.8* 8.8* 8.7*   Liver Function Tests: No results for input(s): "AST", "ALT", "ALKPHOS", "BILITOT", "PROT", "ALBUMIN" in the last 168 hours. No results for input(s): "LIPASE", "AMYLASE" in the last 168 hours. No results for input(s): "AMMONIA" in the last 168 hours. CBC: Recent Labs  Lab 02/03/23 1744 02/04/23 0527 02/05/23 0321  WBC 12.1* 9.6 20.4*  NEUTROABS 8.9*  --   --   HGB 15.1 13.5 12.8*  HCT 44.9 40.0 38.7*  MCV 101.1* 100.0 101.8*  PLT 155 133* 134*   Cardiac Enzymes: No results for input(s): "CKTOTAL", "CKMB", "CKMBINDEX", "TROPONINI" in the last 168 hours. BNP: Invalid input(s): "POCBNP" CBG: No results for input(s): "GLUCAP" in the last 168 hours. D-Dimer No results for input(s): "DDIMER" in the last 72 hours. Hgb A1c No results for input(s): "HGBA1C" in the last 72 hours. Lipid Profile No results for input(s): "CHOL", "HDL", "LDLCALC", "TRIG", "CHOLHDL",  "LDLDIRECT" in the last 72 hours. Thyroid function studies No results for input(s): "TSH", "T4TOTAL", "T3FREE", "THYROIDAB" in the last 72 hours.  Invalid input(s): "FREET3" Anemia work up No results for input(s): "VITAMINB12", "FOLATE", "FERRITIN", "TIBC", "IRON", "RETICCTPCT" in the last 72 hours. Urinalysis    Component Value Date/Time   COLORURINE Yellow 01/14/2013 2040   APPEARANCEUR Clear 01/14/2013 2040   LABSPEC 1.030 02/17/2013 1044   PHURINE 6.0 02/17/2013 1044   PHURINE 5.0 01/14/2013 2040   GLUCOSEU 500 02/17/2013 1044   HGBUR Negative 02/17/2013 1044   BILIRUBINUR Negative 02/17/2013 1044   KETONESUR Negative 02/17/2013 1044   PROTEINUR 300 02/17/2013 1044   UROBILINOGEN 0.2 02/17/2013 1044   NITRITE Negative 02/17/2013 1044   LEUKOCYTESUR Negative 02/17/2013 1044   Sepsis Labs Recent Labs  Lab 02/03/23 1744 02/04/23 0527 02/05/23 0321  WBC 12.1* 9.6 20.4*   Microbiology Recent Results (from the past 240 hour(s))  Surgical pcr screen     Status: None   Collection Time: 02/04/23  1:02 AM   Specimen: Nasal Mucosa; Nasal Swab  Result Value Ref Range Status   MRSA, PCR NEGATIVE NEGATIVE Final   Staphylococcus aureus NEGATIVE NEGATIVE Final    Comment: (NOTE) The Xpert SA Assay (FDA approved for NASAL specimens in patients 16 years of age and older), is one component of a comprehensive surveillance program. It is not intended to diagnose infection nor to guide or monitor treatment. Performed at [facility], [address], [place], [place] [address]     Procedures/Studies: DG Pelvis Portable  Result Date: 02/04/2023 CLINICAL DATA:  Status post left total hip arthroplasty. EXAM: PORTABLE PELVIS 1-2 VIEWS COMPARISON:  02/03/23 FINDINGS: Interval left total hip arthroplasty. The hardware components are in anatomic alignment. There are no signs of periprosthetic fracture or dislocation. Right hip appears intact and located. IMPRESSION:  Status post left total hip arthroplasty. No acute findings. Electronically Signed   By: Signa Kell M.D.   On: 02/04/2023 16:53   DG HIP UNILAT WITH PELVIS 1V LEFT  Result Date:  02/04/2023 CLINICAL DATA:  Left hip replacement. EXAM: DG HIP (WITH OR WITHOUT PELVIS) 1V*L* COMPARISON:  Left hip x-rays from yesterday. FLUOROSCOPY TIME:  Radiation Exposure Index (as provided by the fluoroscopic device): 0.94 mGy Kerma C-arm fluoroscopic images were obtained intraoperatively and submitted for post operative interpretation. FINDINGS: Multiple intraoperative fluoroscopic images demonstrate left total hip arthroplasty. Components are well aligned. Femoral head component not yet been placed. IMPRESSION: 1. Intraoperative fluoroscopic guidance for left total hip arthroplasty. Electronically Signed   By: Obie Dredge M.D.   On: 02/04/2023 16:38   DG C-Arm 1-60 Min-No Report  Result Date: 02/04/2023 Fluoroscopy was utilized by the requesting physician.  No radiographic interpretation.   CT HEAD WO CONTRAST ( )  Result Date: 02/03/2023 CLINICAL DATA:  Head trauma. EXAM: CT HEAD WITHOUT CONTRAST TECHNIQUE: Contiguous axial images were obtained from the base of the skull through the vertex without intravenous contrast. RADIATION DOSE REDUCTION: This exam was performed according to the departmental dose-optimization program which includes automated exposure control, adjustment of the mA and/or kV according to patient size and/or use of iterative reconstruction technique. COMPARISON:  None Available. FINDINGS: Brain: No evidence of acute infarction, hemorrhage, hydrocephalus, extra-axial collection or mass lesion/mass effect. Vascular: No hyperdense vessel or unexpected calcification. Skull: Normal. Negative for fracture or focal lesion. Sinuses/Orbits: No acute finding. Other: None. IMPRESSION: No acute intracranial abnormality. Electronically Signed   By: Darliss Cheney M.D.   On: 02/03/2023 17:52   DG Chest  1 View  Result Date: 02/03/2023 CLINICAL DATA:  Preoperative chest x-ray.  Left hip fracture. EXAM: CHEST  1 VIEW COMPARISON:  Chest x-ray dated June 22, 2014. FINDINGS: The heart size and mediastinal contours are within normal limits. Both lungs are clear. The visualized skeletal structures are unremarkable. IMPRESSION: No active disease. Electronically Signed   By: Obie Dredge M.D.   On: 02/03/2023 17:49   DG Hip Unilat W or Wo Pelvis 2-3 Views Left  Result Date: 02/03/2023 CLINICAL DATA:  Fall.  Left hip pain.  Unable to ambulate. EXAM: DG HIP (WITH OR WITHOUT PELVIS) 2-3V LEFT COMPARISON:  None Available. FINDINGS: A nondisplaced left femoral neck fracture is present. Acetabulum is intact. The distal femur is unremarkable. The bony pelvis is within normal limits. IMPRESSION: Nondisplaced left femoral neck fracture. Electronically Signed   By: Marin Roberts M.D.   On: 02/03/2023 16:30     Time coordinating discharge: Over 30 minutes    Lewie Chamber, MD  [facility] 02/05/2023, 2:59 PM

## 2023-02-05 NOTE — Progress Notes (Signed)
PT TX NOTE  02/05/23 1200  PT Visit Information  Last PT Received On 02/05/23  Assistance Needed Excellent progress with mobility, good gait stability with light reliance on RW, reviewed stairs, use of RW for safety and progression to crutch(es) or cane depending on pain and stability.  Pt and caregiver (mother) verbalize understanding. Ready to d/c from PT standpoint with assist prn  History of Present Illness 52 y.o. male with medical history significant for COPD, anxiety, osteoporosis, tobacco use who is admitted with a left hip fracture after mechanical fall. orhto consulted   pt is now s/p L THA, DA on 02/04/23 per Dr Charlann Boxer  Subjective Data  Patient Stated Goal home soon  Precautions  Precautions Fall  Restrictions  Other Position/Activity Restrictions WBAT  Pain Assessment  Pain Assessment No/denies pain  Cognition  Arousal Alert  Behavior During Therapy [facility] for tasks assessed/performed  Overall Cognitive Status Within Functional Limits for tasks assessed  Bed Mobility  Overal bed mobility Modified Independent  Transfers  Overall transfer level Needs assistance  Equipment used Rolling walker (2 wheels)  Transfers Sit to/from Stand  Sit to Stand Supervision  General transfer comment cues for hand placement  Ambulation/Gait  Ambulation/Gait assistance Supervision;Modified independent (Device/Increase time)  Gait Distance (Feet) 300 Feet  Assistive device Rolling walker (2 wheels)  Gait Pattern/deviations Step-through pattern  General Gait Details good stability, light use of RW and near equal wt shift  Stairs Yes  Stairs assistance Supervision  Stair Management Two rails;Step to pattern;Forwards  Number of Stairs 4  General stair comments supervision for IV line and safety only; no physical assist. cues for sequence  PT - End of Session  Equipment Utilized During Treatment Gait belt  Activity Tolerance Patient tolerated treatment well  Patient left in chair;with call  bell/phone within reach;with chair alarm set  Nurse Communication Mobility status   PT - Assessment/Plan  PT Visit Diagnosis Unsteadiness on feet (R26.81);Other abnormalities of gait and mobility (R26.89)  PT Frequency (ACUTE ONLY) 7X/week  Follow Up Recommendations No PT follow up  Patient can return home with the following Assistance with cooking/housework;Assist for transportation;Help with stairs or ramp for entrance  PT equipment None recommended by PT  AM-PAC PT "6 Clicks" Mobility Outcome Measure (Version 2)  Help needed turning from your back to your side while in a flat bed without using bedrails? 4  Help needed moving from lying on your back to sitting on the side of a flat bed without using bedrails? 4  Help needed moving to and from a bed to a chair (including a wheelchair)? 4  Help needed standing up from a chair using your arms (e.g., wheelchair or bedside chair)? 4  Help needed to walk in hospital room? 4  Help needed climbing 3-5 steps with a railing?  3  6 Click Score 23  Consider Recommendation of Discharge To: Home with no services  PT Goal Progression  Progress towards PT goals Progressing toward goals  Acute Rehab PT Goals  PT Goal Formulation With patient  Time For Goal Achievement 02/12/23  Potential to Achieve Goals Good  PT Time Calculation  PT Start Time (ACUTE ONLY) 1208  PT Stop Time (ACUTE ONLY) 1219  PT Time Calculation (min) (ACUTE ONLY) 11 min  PT General Charges  $$ ACUTE PT VISIT 1 Visit  PT Treatments  $Gait Training 8-22 mins

## 2023-02-05 NOTE — Evaluation (Signed)
Physical Therapy Evaluation Patient Details Name: Walter Alvarado MRN: [id number] DOB: 19-Feb-1971 Today's Date: 02/05/2023  History of Present Illness  52 y.o. male with medical history significant for COPD, anxiety, osteoporosis, tobacco use who is admitted with a left hip fracture after mechanical fall. orhto consulted   pt is now s/p L THA, DA on 02/04/23 per Dr Charlann Boxer  Clinical Impression  Pt is s/p THA resulting in the deficits listed below (see PT Problem List).  PT is doing quite well today, pain controlled; will see again to reinforce safety with DME/mobility and review stairs. Pt should be ready to d/c after second session  Pt will benefit from acute skilled PT to increase their independence and safety with mobility to facilitate discharge.          If plan is discharge home, recommend the following: Assistance with cooking/housework;Assist for transportation;Help with stairs or ramp for entrance   Can travel by private vehicle        Equipment Recommendations None recommended by PT  Recommendations for Other Services       Functional Status Assessment Patient has had a recent decline in their functional status and demonstrates the ability to make significant improvements in function in a reasonable and predictable amount of time.     Precautions / Restrictions Precautions Precautions: Fall Restrictions Weight Bearing Restrictions: No Other Position/Activity Restrictions: WBAT      Mobility  Bed Mobility Overal bed mobility: Modified Independent                  Transfers Overall transfer level: Needs assistance Equipment used: Rolling walker (2 wheels) Transfers: Sit to/from Stand Sit to Stand: Supervision, Contact guard assist           General transfer comment: cues for hand placement    Ambulation/Gait Ambulation/Gait assistance: Supervision, Contact guard assist Gait Distance (Feet): 180 Feet Assistive device: Rolling walker (2 wheels) Gait  Pattern/deviations: Step-to pattern, Step-through pattern       General Gait Details: cues for sequence initially and use of RW to offload LLE as needed. steady gait with RW, incr wt shift to LLE with incr distance  Stairs            Wheelchair Mobility     Tilt Bed    Modified Rankin (Stroke Patients Only)       Balance   Sitting-balance support: Feet supported, No upper extremity supported Sitting balance-Leahy Scale: Good     Standing balance support: During functional activity, Reliant on assistive device for balance Standing balance-Leahy Scale: Fair                               Pertinent Vitals/Pain      Home Living Family/patient expects to be discharged to:: Private residence Living Arrangements: Children   Type of Home: Mobile home Home Access: Stairs to enter Entrance Stairs-Rails: Doctor, general practice of Steps: 4   Home Layout: One level Home Equipment: Agricultural consultant (2 wheels);Wheelchair - manual;BSC/3in1      Prior Function Prior Level of Function : Independent/Modified Independent                     Extremity/Trunk Assessment   Upper Extremity Assessment Upper Extremity Assessment: Overall WFL for tasks assessed    Lower Extremity Assessment Lower Extremity Assessment: LLE deficits/detail LLE Deficits / Details: grossly WFL for AROM, strength at least 3/5 hip and knee  Communication      Cognition Arousal: Alert Behavior During Therapy: WFL for tasks assessed/performed Overall Cognitive Status: Within Functional Limits for tasks assessed                                          General Comments      Exercises     Assessment/Plan    PT Assessment Patient needs continued PT services  PT Problem List Decreased activity tolerance;Decreased mobility;Decreased knowledge of use of DME;Decreased balance       PT Treatment Interventions DME instruction;Gait  training;Functional mobility training;Therapeutic activities;Therapeutic exercise;Patient/family education;Stair training    PT Goals (Current goals can be found in the Care Plan section)  Acute Rehab PT Goals Patient Stated Goal: home soon PT Goal Formulation: With patient Time For Goal Achievement: 02/12/23 Potential to Achieve Goals: Good    Frequency 7X/week     Co-evaluation               AM-PAC PT "6 Clicks" Mobility  Outcome Measure Help needed turning from your back to your side while in a flat bed without using bedrails?: None Help needed moving from lying on your back to sitting on the side of a flat bed without using bedrails?: None Help needed moving to and from a bed to a chair (including a wheelchair)?: A Little Help needed standing up from a chair using your arms (e.g., wheelchair or bedside chair)?: A Little Help needed to walk in hospital room?: A Little Help needed climbing 3-5 steps with a railing? : A Little 6 Click Score: 20    End of Session Equipment Utilized During Treatment: Gait belt Activity Tolerance: Patient tolerated treatment well Patient left: in bed;with call bell/phone within reach;with bed alarm set   PT Visit Diagnosis: Unsteadiness on feet (R26.81);Other abnormalities of gait and mobility (R26.89)    Time: 1051-1110 PT Time Calculation (min) (ACUTE ONLY): 19 min   Charges:   PT Evaluation $PT Eval Low Complexity: 1 Low   PT General Charges $$ ACUTE PT VISIT: 1 Visit         Carreen Milius, PT  Acute Rehab Dept [facility]) [phone]  02/05/2023   [facility] 02/05/2023, 11:14 AM

## 2023-02-05 NOTE — Plan of Care (Signed)
Patient discharging home via private vehicle with mother Libero Puthoff. Haydee Salter, RN 02/05/23 1:24 PM

## 2023-02-05 NOTE — TOC Initial Note (Signed)
Transition of Care [facility]) - Initial/Assessment Note   Patient Details  Name: Walter Alvarado MRN: [id number] Date of Birth: 02-25-1971  Transition of Care [facility]) CM/SW Contact:    Ewing Schlein, LCSW Phone Number: 02/05/2023, 12:07 PM  Clinical Narrative: [facility] consulted for possible PT and DME needs. PT evaluated patient and he does not need any PT follow up or DME set up at this time, so there are no TOC needs at this time.  Expected Discharge Plan: Home/Self Care Barriers to Discharge: Continued Medical Work up  Patient Goals and CMS Choice Patient states their goals for this hospitalization and ongoing recovery are:: Return home Choice offered to / list presented to : NA  Expected Discharge Plan and Services In-house Referral: Clinical Social Work Post Acute Care Choice: NA Living arrangements for the past 2 months: Apartment             DME Arranged: N/A DME Agency: NA  Prior Living Arrangements/Services Living arrangements for the past 2 months: Apartment Patient language and need for interpreter reviewed:: Yes Do you feel safe going back to the place where you live?: Yes      Need for Family Participation in Patient Care: No (Comment) Care giver support system in place?: Yes (comment) Criminal Activity/Legal Involvement Pertinent to Current Situation/Hospitalization: No - Comment as needed  Activities of Daily Living ADL Screening (condition at time of admission) Independently performs ADLs?: Yes (appropriate for developmental age) Is the patient deaf or have difficulty hearing?: Yes Does the patient have difficulty seeing, even when wearing glasses/contacts?: No Does the patient have difficulty concentrating, remembering, or making decisions?: No  Emotional Assessment Orientation: : Oriented to Self, Oriented to Place, Oriented to  Time, Oriented to Situation Alcohol / Substance Use: Not Applicable Psych Involvement: No (comment)  Admission diagnosis:  Closed fracture  of neck of left femur (HCC) [S72.002A] Closed fracture of left hip, initial encounter (HCC) [S72.002A] Fall, initial encounter [W19.XXXA] Patient Active Problem List   Diagnosis Date Noted   S/P total left hip arthroplasty 02/04/2023   Closed fracture of neck of left femur (HCC) 02/03/2023   Anxiety    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 [place] [address] Phone: [phone] Fax: [phone]  [id number] - [place], [place] - [address] [place] [address] Phone: [phone] Fax: [phone]  Social Determinants of Health (SDOH) Social History: SDOH Screenings   Food Insecurity: No Food Insecurity (02/03/2023)  Housing: Low Risk  (02/03/2023)  Transportation Needs: No Transportation Needs (02/03/2023)  Utilities: Not At Risk (02/03/2023)  Tobacco Use: High Risk (02/03/2023)   SDOH Interventions:    Readmission Risk Interventions     No data to display

## 2023-02-05 NOTE — Progress Notes (Signed)
Patient ID: Walter Alvarado, male   DOB: March 21, 1971, 52 y.o.   MRN: [id number] Subjective: 1 Day Post-Op Procedure(s) (LRB): TOTAL HIP ARTHROPLASTY ANTERIOR APPROACH (Left)    Patient reports pain as mild. No events overnight  Objective:   VITALS:   Vitals:   02/05/23 0218 02/05/23 0610  BP: 136/89 130/80  Pulse: 93 72  Resp: 17 16  Temp: 98.3 F (36.8 C) (!) 97.5 F (36.4 C)  SpO2: 95% 93%    Neurovascular intact Incision: dressing C/D/I Already moving LLE quite well  LABS Recent Labs    02/03/23 1744 02/04/23 0527 02/05/23 0321  HGB 15.1 13.5 12.8*  HCT 44.9 40.0 38.7*  WBC 12.1* 9.6 20.4*  PLT 155 133* 134*    Recent Labs    02/03/23 1744 02/04/23 0527 02/05/23 0321  NA 137 138 135  K 3.6 3.9 4.0  BUN 13 13 17   CREATININE 0.82 0.64 0.67  GLUCOSE 101* 111* 144*    Recent Labs    02/03/23 1744  INR 1.0     Assessment/Plan: 1 Day Post-Op Procedure(s) (LRB): TOTAL HIP ARTHROPLASTY ANTERIOR APPROACH (Left)   Advance diet Up with therapy  Will likely be able to go home today after therapy RTC in 2 weeks ASA 81 mg BID for 4 weeks for DVT prohylaxis WBAT Pain meds likely for 7 days

## 2023-02-23 DIAGNOSIS — Z419 Encounter for procedure for purposes other than remedying health state, unspecified: Secondary | ICD-10-CM | POA: Diagnosis not present

## 2023-03-25 DIAGNOSIS — Z419 Encounter for procedure for purposes other than remedying health state, unspecified: Secondary | ICD-10-CM | POA: Diagnosis not present

## 2023-04-25 DIAGNOSIS — Z419 Encounter for procedure for purposes other than remedying health state, unspecified: Secondary | ICD-10-CM | POA: Diagnosis not present

## 2023-05-26 DIAGNOSIS — Z419 Encounter for procedure for purposes other than remedying health state, unspecified: Secondary | ICD-10-CM | POA: Diagnosis not present

## 2023-06-23 DIAGNOSIS — Z419 Encounter for procedure for purposes other than remedying health state, unspecified: Secondary | ICD-10-CM | POA: Diagnosis not present

## 2023-08-04 DIAGNOSIS — Z419 Encounter for procedure for purposes other than remedying health state, unspecified: Secondary | ICD-10-CM | POA: Diagnosis not present

## 2023-09-03 DIAGNOSIS — Z419 Encounter for procedure for purposes other than remedying health state, unspecified: Secondary | ICD-10-CM | POA: Diagnosis not present

## 2023-10-04 DIAGNOSIS — Z419 Encounter for procedure for purposes other than remedying health state, unspecified: Secondary | ICD-10-CM | POA: Diagnosis not present

## 2023-11-03 DIAGNOSIS — Z419 Encounter for procedure for purposes other than remedying health state, unspecified: Secondary | ICD-10-CM | POA: Diagnosis not present

## 2023-12-04 DIAGNOSIS — Z419 Encounter for procedure for purposes other than remedying health state, unspecified: Secondary | ICD-10-CM | POA: Diagnosis not present

## 2024-01-04 DIAGNOSIS — Z419 Encounter for procedure for purposes other than remedying health state, unspecified: Secondary | ICD-10-CM | POA: Diagnosis not present

## 2024-02-03 DIAGNOSIS — Z419 Encounter for procedure for purposes other than remedying health state, unspecified: Secondary | ICD-10-CM | POA: Diagnosis not present
